# Patient Record
Sex: Male | Born: 1950 | Race: White | Hispanic: No | Marital: Married | Smoking: Former smoker
Health system: Southern US, Community
[De-identification: ages and names within clinical notes are randomized; demographics above are authoritative.]

## PROBLEM LIST (undated history)

## (undated) DIAGNOSIS — I10 Essential (primary) hypertension: Secondary | ICD-10-CM

## (undated) DIAGNOSIS — S42342A Displaced spiral fracture of shaft of humerus, left arm, initial encounter for closed fracture: Secondary | ICD-10-CM

## (undated) DIAGNOSIS — K219 Gastro-esophageal reflux disease without esophagitis: Secondary | ICD-10-CM

## (undated) HISTORY — DX: Gastro-esophageal reflux disease without esophagitis: K21.9

## (undated) HISTORY — PX: VASECTOMY: SHX75

## (undated) HISTORY — DX: Essential (primary) hypertension: I10

## (undated) HISTORY — DX: Displaced spiral fracture of shaft of humerus, left arm, initial encounter for closed fracture: S42.342A

## (undated) HISTORY — PX: CATARACT EXTRACTION: SUR2

## (undated) HISTORY — PX: COLONOSCOPY: SHX174

---

## 2008-10-19 ENCOUNTER — Encounter: Payer: Self-pay | Admitting: Family Medicine

## 2008-10-19 LAB — CONVERTED CEMR LAB
Cholesterol: 206 mg/dL
Creatinine, Ser: 1.12 mg/dL
Glucose, Bld: 94 mg/dL
HDL: 61 mg/dL
LDL Cholesterol: 131 mg/dL
PSA: 2 ng/mL
TSH: 2.24 microintl units/mL
Triglycerides: 70 mg/dL

## 2009-11-28 ENCOUNTER — Ambulatory Visit: Payer: Self-pay | Admitting: Family Medicine

## 2009-12-29 ENCOUNTER — Encounter: Payer: Self-pay | Admitting: Family Medicine

## 2010-04-24 NOTE — Miscellaneous (Signed)
  Clinical Lists Changes  Observations: Added new observation of TDBOOSTDUE: 03/25/2017 (12/29/2009 15:09) Added new observation of DM PROGRESS: N/A (12/29/2009 15:09) Added new observation of DM FSREVIEW: N/A (12/29/2009 15:09) Added new observation of HTN PROGRESS: N/A (12/29/2009 15:09) Added new observation of HTN FSREVIEW: N/A (12/29/2009 15:09) Added new observation of LIPID PROGRS: N/A (12/29/2009 15:09) Added new observation of LIPID FSREVW: N/A (12/29/2009 15:09) Added new observation of COLONOSCOPY: Normal (01/27/2009 15:09) Added new observation of TSH: 2.24 microintl units/mL (10/19/2008 15:09) Added new observation of PSA: 2.0 ng/mL (10/19/2008 15:09) Added new observation of CREATININE: 1.12 mg/dL (03/27/7251 66:44) Added new observation of BG RANDOM: 94 mg/dL (03/47/4259 56:38) Added new observation of LDL: 131 mg/dL (75/64/3329 51:88) Added new observation of HDL: 61 mg/dL (41/66/0630 16:01) Added new observation of TRIGLYC TOT: 70 mg/dL (09/32/3557 32:20) Added new observation of CHOLESTEROL: 206 mg/dL (25/42/7062 37:62) Added new observation of COLONOSCOPY: Normal  (12/23/2003 15:09)          Prevention & Chronic Care Immunizations   Influenza vaccine: Not documented    Tetanus booster: 03/26/2007: Tdap   Tetanus booster due: 03/25/2017    Pneumococcal vaccine: Not documented  Colorectal Screening   Hemoccult: Not documented    Colonoscopy: Normal  (01/27/2009)  Other Screening   PSA: 2.0  (10/19/2008)   Smoking status: Not documented  Lipids   Total Cholesterol: 206  (10/19/2008)   LDL: 131  (10/19/2008)   LDL Direct: Not documented   HDL: 61  (10/19/2008)   Triglycerides: 70  (10/19/2008)

## 2010-04-24 NOTE — Assessment & Plan Note (Signed)
Summary: NEW PT TO BE ESTABLISHED/JRR   Vital Signs:  Patient profile:   60 year old male Height:      71.75 inches Weight:      216.25 pounds BMI:     29.64 Temp:     98 degrees F oral Pulse rate:   84 / minute Pulse rhythm:   regular BP sitting:   130 / 84  (left arm) Cuff size:   large  Vitals Entered By: Delilah Shan CMA Shawntelle Ungar Dull) (November 28, 2009 9:52 AM) CC: New Patient to Establish   History of Present Illness: New Pt.  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   Neck pain on R side.  "I think it's a pulled muscle."  Going on intermittently for last month.  Moved in 7 weeks ago.  Same pillow and mattress.  Gradual onset.  No trauma.   No other c/o.   Preventive Screening-Counseling & Management  Caffeine-Diet-Exercise     Does Patient Exercise: yes  Allergies (verified): 1)  ! 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 Family History Breast cancer 1st degree relative <50, parents Family History Diabetes 1st degree relative, parents Family History High cholesterol, parents Family History Hypertension, parents Family History of Heart Disease,  parents F alive, healthy M dead, breast CA, DM, HTN, HLD, CAD half brother, dead from DM  Social History: Reviewed history and no changes required. Marital Status: Married, 1975 Children: 3 children Occupation: VP Tree surgeon Education:  Automotive engineer, Tax adviser college Alcohol use-yes, a few drinks a week Occ cigar Regular exercise-yes walking 4x/week Moved from [place] 2011 Likes to golf Does Patient Exercise:  yes  Review of Systems       See HPI.  Otherwise negative.    Physical Exam  General:  GEN: nad, alert and oriented HEENT: mucous membranes moist NECK: supple w/o LA, Minimally tender to palpation on R posterior side, not in midline.  no skin changes in the area.  No meningeal signs.  CV: rrr.  no murmur PULM: ctab, no inc wob ABD: soft, +bs EXT: no edema SKIN: no acute rash     Impression & Recommendations:  Problem # 1:  CERVICAL STRAIN, RIGHT (ICD-847.0) D/w patient [id number] and follow up as needed.   This should resolve on its own.  He understood.  Fu for phsyical, requesting records.   Patient Instructions: 1)  Glad to see you today.  I would get a flu shot this fall.  Please schedule a physical for this spring.  Take care.   Prior Medications (reviewed today): None Current Allergies (reviewed today): ! * TOMAOTES   Preventive Care Screening  Colonoscopy:    Date:  03/25/2008    Results:  Done   Last Tetanus Booster:    Date:  03/26/2007  Results:  Tdap

## 2010-05-03 ENCOUNTER — Encounter: Payer: Self-pay | Admitting: *Deleted

## 2010-06-18 ENCOUNTER — Other Ambulatory Visit: Payer: Self-pay | Admitting: Family Medicine

## 2010-06-18 DIAGNOSIS — Z833 Family history of diabetes mellitus: Secondary | ICD-10-CM | POA: Insufficient documentation

## 2010-06-19 ENCOUNTER — Other Ambulatory Visit (INDEPENDENT_AMBULATORY_CARE_PROVIDER_SITE_OTHER): Payer: BC Managed Care – PPO | Admitting: Family Medicine

## 2010-06-19 DIAGNOSIS — Z833 Family history of diabetes mellitus: Secondary | ICD-10-CM

## 2010-06-19 LAB — COMPREHENSIVE METABOLIC PANEL
Albumin: 3.9 g/dL (ref 3.5–5.2)
Alkaline Phosphatase: 63 U/L (ref 39–117)
BUN: 15 mg/dL (ref 6–23)
Glucose, Bld: 92 mg/dL (ref 70–99)
Potassium: 4.8 mEq/L (ref 3.5–5.1)

## 2010-06-19 LAB — LIPID PANEL
Cholesterol: 200 mg/dL (ref 0–200)
LDL Cholesterol: 136 mg/dL — ABNORMAL HIGH (ref 0–99)
Triglycerides: 59 mg/dL (ref 0.0–149.0)

## 2010-06-25 ENCOUNTER — Encounter: Payer: Self-pay | Admitting: Family Medicine

## 2010-06-26 ENCOUNTER — Ambulatory Visit (INDEPENDENT_AMBULATORY_CARE_PROVIDER_SITE_OTHER): Payer: BC Managed Care – PPO | Admitting: Family Medicine

## 2010-06-26 ENCOUNTER — Encounter: Payer: Self-pay | Admitting: Family Medicine

## 2010-06-26 DIAGNOSIS — Z0000 Encounter for general adult medical examination without abnormal findings: Secondary | ICD-10-CM | POA: Insufficient documentation

## 2010-06-26 DIAGNOSIS — Z833 Family history of diabetes mellitus: Secondary | ICD-10-CM

## 2010-06-26 NOTE — Assessment & Plan Note (Addendum)
PSA options were discussed along with recent recs.  No indication for psa at this point, since patient is low risk and there is no FH of prosate CA.  He declined testing of PSA.  DRE wnl and stool heme neg. Colonoscopy done 2010.  Flu shot each fall.  PNA/zostavax in 60s.  Labs reviewed with patient.  Labs are okay for now.  Work on diet and exercise.  He'll get a new pillow for his neck and will fu prn.  He agrees with the plan.  Recheck BP was normal.

## 2010-06-26 NOTE — Assessment & Plan Note (Signed)
Labs d/w pt.  Sugar wnl.  D/w pt JY:NWGN/FAOZHYQM/VHQION.

## 2010-06-26 NOTE — Progress Notes (Signed)
CPE- See plan.  Routine anticipatory guidance given to patient.  See health maintenance.  Recent labs d/w pt.   Occ R paraspinal muscle tightness.  No trauma.  He hasn't changed pillows.    FH, PMH and SH reviewed  Meds, vitals, and allergies reviewed.   ROS: See HPI.  Otherwise negative.    GEN: nad, alert and oriented HEENT: mucous membranes moist, tm wnlx2, nasal and oral exam wnl NECK: supple w/o LA, minimal R paraspinal tightness noted CV: rrr. PULM: ctab, no inc wob ABD: soft, +bs EXT: no edema SKIN: no acute rash Prostate gland firm and smooth, no enlargement, nodularity, tenderness, mass, asymmetry or induration.

## 2010-06-26 NOTE — Patient Instructions (Signed)
Take care.  Keep exercising and working on your diet.  Try to cut back on cigars.  Glad to see you today.  I would recheck your labs at a physical in 1 year.  If you have concerns in the meantime, please let me know.

## 2011-03-08 ENCOUNTER — Ambulatory Visit (INDEPENDENT_AMBULATORY_CARE_PROVIDER_SITE_OTHER): Payer: BC Managed Care – PPO | Admitting: Family Medicine

## 2011-03-08 ENCOUNTER — Encounter: Payer: Self-pay | Admitting: Family Medicine

## 2011-03-08 VITALS — BP 142/80 | HR 70 | Temp 98.1°F | Wt 214.0 lb

## 2011-03-08 DIAGNOSIS — R55 Syncope and collapse: Secondary | ICD-10-CM

## 2011-03-08 LAB — COMPREHENSIVE METABOLIC PANEL
ALT: 26 U/L (ref 0–53)
AST: 21 U/L (ref 0–37)
CO2: 29 mEq/L (ref 19–32)
Calcium: 8.8 mg/dL (ref 8.4–10.5)
Chloride: 107 mEq/L (ref 96–112)
GFR: 59.96 mL/min — ABNORMAL LOW (ref >60.00)
Sodium: 141 mEq/L (ref 135–145)
Total Protein: 6.9 g/dL (ref 6.0–8.3)

## 2011-03-08 LAB — CBC WITH DIFFERENTIAL/PLATELET
Basophils Absolute: 0 10*3/uL (ref 0.0–0.1)
Eosinophils Absolute: 0.1 10*3/uL (ref 0.0–0.7)
HCT: 42.4 % (ref 39.0–52.0)
Hemoglobin: 14.8 g/dL (ref 13.0–17.0)
Lymphocytes Relative: 19.9 % (ref 12.0–46.0)
Lymphs Abs: 1.3 10*3/uL (ref 0.7–4.0)
MCHC: 34.9 g/dL (ref 30.0–36.0)
Monocytes Absolute: 0.7 10*3/uL (ref 0.1–1.0)
Neutro Abs: 4.4 10*3/uL (ref 1.4–7.7)
RDW: 12.6 % (ref 11.5–14.6)

## 2011-03-08 NOTE — Progress Notes (Signed)
Episodic presyncopal, lightheaded feeling.  Had been going on for months.    This past weekend he passed out, early AM right after he urinated.  He woke up on the floor.  Nose was sore after the fall.  Wife helped him up.  Got back in bed.  He felt really hot at that point.  Not SOB, no CP.  No focal neuro sx.  No speech changes.  No tongue biting.  No s/o SZ, syncope o/w.  No h/o CAD.  Not orthostatic here today.    Meds, vitals, and allergies reviewed.   ROS: See HPI.  Otherwise, noncontributory.  GEN: nad, alert and oriented HEENT: mucous membranes moist, tm wnl, not ttp around the orbits but bridge of nose is minimally ttp w/o obvious asymmetry, internal nasal exam w/o sig abnormality noted NECK: supple w/o LA CV: rrr PULM: ctab, no inc wob ABD: soft, +bs EXT: no edema SKIN: no acute rash CN 2-12 wnl B, S/S/DTR wnl x4

## 2011-03-08 NOTE — Patient Instructions (Signed)
See Shirlee Limerick about your referral before you leave today. You can get your results through our phone system.  Follow the instructions on the blue card.

## 2011-03-10 DIAGNOSIS — R55 Syncope and collapse: Secondary | ICD-10-CM | POA: Insufficient documentation

## 2011-03-10 NOTE — Assessment & Plan Note (Signed)
EKG reviewed.  I would like cards input for consideration of event monitor give the prev presyncopal feelings that predate the syncopal event.  This may have been an isolated vagal event.  See notes on labs.  D/w pt.  Okay for outpatient f/u.  >25 min spent with face to face with patient, >50% counseling and/or coordinating care.

## 2011-03-20 ENCOUNTER — Ambulatory Visit (INDEPENDENT_AMBULATORY_CARE_PROVIDER_SITE_OTHER): Payer: BC Managed Care – PPO | Admitting: Cardiology

## 2011-03-20 ENCOUNTER — Encounter: Payer: Self-pay | Admitting: Cardiology

## 2011-03-20 DIAGNOSIS — R42 Dizziness and giddiness: Secondary | ICD-10-CM

## 2011-03-20 DIAGNOSIS — R030 Elevated blood-pressure reading, without diagnosis of hypertension: Secondary | ICD-10-CM

## 2011-03-20 DIAGNOSIS — R55 Syncope and collapse: Secondary | ICD-10-CM

## 2011-03-20 DIAGNOSIS — IMO0001 Reserved for inherently not codable concepts without codable children: Secondary | ICD-10-CM

## 2011-03-20 NOTE — Patient Instructions (Signed)
Start aspirin 81 mg take one tablet daily. TSH today.  Your physician has requested that you have an echocardiogram. Echocardiography is a painless test that uses sound waves to create images of your heart. It provides your doctor with information about the size and shape of your heart and how well your heart's chambers and valves are working. This procedure takes approximately one hour. There are no restrictions for this procedure.  Need to wear a 3 week even monitor. Need to pick up a blood pressure cuff at your local pharmacy,keep a recording of blood pressure & heart rate readings every other day be sure to bring the blood pressure readings to your follow up appointment with Dr. Shirlee Latch in 3-4 weeks.

## 2011-03-21 NOTE — Assessment & Plan Note (Signed)
One syncopal spell but also has occasional episodes of lightheadedness (not associated with palpitations).  The syncopal event after micturation could certainly be vasovagal.  However, it is harder to invoke vasovagal symptoms to explain his episodic lightheaded spells.  I would like to rule out cardiac pathology as a cause of his symptoms.  - Will get echo to make sure that heart is structurally normal.  - Check TSH - Will get 3 week event monitor to look for any significant arrhythmia.

## 2011-03-21 NOTE — Progress Notes (Signed)
PCP: Dr. Para March  60 yo presents for cardiology evaluation of syncope and spells of lightheadedness.   For several months, patient has noted "dizzy spells."  He will feel lightheaded during these spells.  No vertigo-type symptoms.  Lightheadedness can last from 5-10 minutes to as long as several hours.  He has had to go home from work due to the symptoms before.  He does not think the lightheadedness is related to standing.  He does not note palpitations or chest pain.  He has had 1 syncopal event.  This was earlier this month.  He had been sleeping and woke up to go to the bathroom, where he urinated. After urination, he felt lightheaded and passed out briefly, falling to the floor. He was unconscious only for a few seconds.  When he woke up, he felt flushed/hot all over.  This is his only syncopal episode.    At baseline, he has good exercise tolerance with no exertional dyspnea or chest pain.  No history of arrhythmias.   He had orthostatic done at Dr. Lianne Bushy office after his syncopal spell; he was not orthostatic.  Also of note, BP is 152/92 today and has been elevated at times when he checks it at work.   ECG: NSR, normal  Labs (3/12): LDL 136, HDL 52 Labs (12/12): HCT 42.4, K 4.4, creatinine 1.3  PMH: 1. Syncope (12/12) 2. Elevated blood pressure  SH: Married, moved to [place] from [place] 2 years ago.  3 sons.  Nonsmoker (rare cigar).  Occasional ETOH.  FH: Mother with CABG x 3 in her mid 37s.  6 brothers, no CAD.    ROS: All systems reviewed and negative except as per HPI.   Current Outpatient Prescriptions  Medication Sig Dispense Refill  . fish oil-omega-3 fatty acids 1000 MG capsule Take 1 g by mouth daily. 1200 mg. daily       . Multiple Vitamin (MULTIVITAMIN) tablet Take 1 tablet by mouth daily.        Marland Kitchen aspirin EC 81 MG tablet Take 1 tablet (81 mg total) by mouth daily.        BP 152/92  Pulse 57  Ht 5' 11.5" (1.816 m)  Wt 97.578 kg (215 lb 1.9 oz)  BMI 29.59  kg/m2 General: NAD Neck: No JVD, no thyromegaly or thyroid nodule.  Lungs: Clear to auscultation bilaterally with normal respiratory effort. CV: Nondisplaced PMI.  Heart regular S1/S2, no S3/S4, no murmur.  No peripheral edema.  No carotid bruit.  Normal pedal pulses.  Abdomen: Soft, nontender, no hepatosplenomegaly, no distention.  Skin: Intact without lesions or rashes.  Neurologic: Alert and oriented x 3.  Psych: Normal affect. Extremities: No clubbing or cyanosis.  HEENT: Normal.

## 2011-03-21 NOTE — Progress Notes (Signed)
Addended by: Lanny Hurst E on: 03/21/2011 12:11 PM   Modules accepted: Orders

## 2011-03-21 NOTE — Assessment & Plan Note (Addendum)
BP seems to be running high.  He will get a cuff and check his BP daily.  He will bring readings at followup office visit.    Given age and gender as well as lack of bleeding history, I will have him start ASA 81 mg daily for primary prevention.

## 2011-04-09 ENCOUNTER — Other Ambulatory Visit (INDEPENDENT_AMBULATORY_CARE_PROVIDER_SITE_OTHER): Payer: BC Managed Care – PPO | Admitting: *Deleted

## 2011-04-09 DIAGNOSIS — R55 Syncope and collapse: Secondary | ICD-10-CM

## 2011-04-09 DIAGNOSIS — I359 Nonrheumatic aortic valve disorder, unspecified: Secondary | ICD-10-CM

## 2011-04-19 ENCOUNTER — Ambulatory Visit: Payer: BC Managed Care – PPO | Admitting: Cardiology

## 2011-04-19 ENCOUNTER — Encounter: Payer: Self-pay | Admitting: Cardiology

## 2011-04-19 ENCOUNTER — Ambulatory Visit (INDEPENDENT_AMBULATORY_CARE_PROVIDER_SITE_OTHER): Payer: BC Managed Care – PPO | Admitting: Cardiology

## 2011-04-19 DIAGNOSIS — R55 Syncope and collapse: Secondary | ICD-10-CM

## 2011-04-19 DIAGNOSIS — I10 Essential (primary) hypertension: Secondary | ICD-10-CM

## 2011-04-19 DIAGNOSIS — R0683 Snoring: Secondary | ICD-10-CM

## 2011-04-19 DIAGNOSIS — R0609 Other forms of dyspnea: Secondary | ICD-10-CM

## 2011-04-19 MED ORDER — LISINOPRIL-HYDROCHLOROTHIAZIDE 20-12.5 MG PO TABS: 1.0000 | ORAL_TABLET | Freq: Every day | ORAL | Status: DC

## 2011-04-19 NOTE — Patient Instructions (Signed)
Start lisinopril/HCT 20/12.5mg  daily for your blood pressure..  Your physician recommends that you return for lab work in: 2 weeks--BMET 401.9  Take and record your blood pressure. I will call you in 2 weeks to get the readings. Luana Shu [phone]  Your physician has recommended that you have a sleep study. This test records several body functions during sleep, including: brain activity, eye movement, oxygen and carbon dioxide blood levels, heart rate and rhythm, breathing rate and rhythm, the flow of air through your mouth and nose, snoring, body muscle movements, and chest and belly movement.  You do not need to schedule a follow-up appointment with Dr Shirlee Latch.

## 2011-04-21 DIAGNOSIS — I10 Essential (primary) hypertension: Secondary | ICD-10-CM | POA: Insufficient documentation

## 2011-04-21 NOTE — Progress Notes (Signed)
PCP: Dr. Para March  61 yo returns for cardiology evaluation of high blood pressure, syncope and spells of lightheadedness.   For several months prior to last appointment, patient had noted "dizzy spells."  He will feel lightheaded during these spells.  No vertigo-type symptoms.  Lightheadedness can last from 5-10 minutes to as long as several hours.  He has had to go home from work due to the symptoms before.  He does not think the lightheadedness is related to standing.  He does not note palpitations or chest pain.  He has had 1 syncopal event.  This was in 12/12.  He had been sleeping and woke up to go to the bathroom, where he urinated. After urination, he felt lightheaded and passed out briefly, falling to the floor. He was unconscious only for a few seconds.  When he woke up, he felt flushed/hot all over.  This is his only syncopal episode.    At baseline, he has good exercise tolerance with no exertional dyspnea or chest pain.  No history of arrhythmias.   He had orthostatic done at Dr. Lianne Bushy office after his syncopal spell; he was not orthostatic.  SBP has been running quite high at home when he checks, with SBP in the 150s-170s.  He has had no syncope and one mild lightheaded spell since last visit.  The lightheaded spell occurred in the office after a meeting that was somewhat stressful.  Echo was done after last appointment with no significant abnormalities.    Patient reports loud snoring and gasping at night (per his wife).  He has some daytime sleepiness.   Labs (3/12): LDL 136, HDL 52 Labs (12/12): HCT 42.4, K 4.4, creatinine 1.3, TSH normal  PMH: 1. Syncope (12/12): Echo (1/13) with EF 55-60%, grade I datolic  2. HTN  SH: Married, moved to [place] from [place] 2 years ago.  3 sons.  Nonsmoker (rare cigar).  Occasional ETOH.  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   FH: Mother with CABG x 3 in her mid 37s.  6 brothers, no CAD.    ROS: All systems reviewed and  negative except as per HPI.   Current Outpatient Prescriptions  Medication Sig Dispense Refill  . aspirin EC 81 MG tablet Take 1 tablet (81 mg total) by mouth daily.      . Coenzyme Q10 (CO Q 10 PO) Take 10 mg by mouth daily.      . fish oil-omega-3 fatty acids 1000 MG capsule Take 1 g by mouth daily. 1200 mg. daily       . Multiple Vitamin (MULTIVITAMIN) tablet Take 1 tablet by mouth daily.        Marland Kitchen lisinopril-hydrochlorothiazide (PRINZIDE,ZESTORETIC) 20-12.5 MG per tablet Take 1 tablet by mouth daily.  30 tablet  6    BP 150/98  Pulse 62  Ht 5\' 11"  (1.803 m)  Wt 96.979 kg (213 lb 12.8 oz)  BMI 29.82 kg/m2 General: NAD Neck: No JVD, no thyromegaly or thyroid nodule.  Lungs: Clear to auscultation bilaterally with normal respiratory effort. CV: Nondisplaced PMI.  Heart regular S1/S2, +S4, no murmur.  No peripheral edema.  No carotid bruit.  Normal pedal pulses.  Abdomen: Soft, nontender, no hepatosplenomegaly, no distention.  Neurologic: Alert and oriented x 3.  Psych: Normal affect. Extremities: No clubbing or cyanosis.

## 2011-04-21 NOTE — Assessment & Plan Note (Signed)
BP running quite high.  I will start lisinopril/HCTZ 20/25 mg daily.  BMET and BP check in 2 wks.  Given symptoms possibly consistent with sleep apnea, I will get a sleep study.  OSA could certainly worsen HTN.

## 2011-04-21 NOTE — Assessment & Plan Note (Signed)
One syncopal spell but also has occasional episodes of lightheadedness (not associated with palpitations).  The syncopal event after micturation could certainly be vasovagal.  However, it is harder to invoke vasovagal symptoms to explain his episodic lightheaded spells.  I would like to rule out cardiac pathology as a cause of his symptoms.  As above, echo and TSH were essentially normal.  - Patient is wearing his 3-week monitor . This will be completed next week.  If this is normal, no further cardiac workup.

## 2011-05-03 ENCOUNTER — Ambulatory Visit (INDEPENDENT_AMBULATORY_CARE_PROVIDER_SITE_OTHER): Payer: BC Managed Care – PPO | Admitting: *Deleted

## 2011-05-03 DIAGNOSIS — I10 Essential (primary) hypertension: Secondary | ICD-10-CM

## 2011-05-04 LAB — BASIC METABOLIC PANEL
BUN/Creatinine Ratio: 18 (ref 9–20)
Creatinine, Ser: 1.19 mg/dL (ref 0.76–1.27)
GFR calc Af Amer: 77 mL/min/{1.73_m2} (ref >59)
GFR calc non Af Amer: 66 mL/min/{1.73_m2} (ref >59)
Sodium: 141 mmol/L (ref 134–144)

## 2011-05-06 ENCOUNTER — Telehealth: Payer: Self-pay | Admitting: Cardiology

## 2011-05-06 NOTE — Telephone Encounter (Signed)
BP readings sent in by pt 05/03/11 dated 04/17/11-05/02/11 reviewed by Dr Shirlee Latch 05/06/11. BP much better. Discussed with pt.  Monitor done 04/23/10-04/22/11 reviewed by Dr Shirlee Latch. No new recommendations by Dr Shirlee Latch. Discussed with pt.

## 2011-05-07 ENCOUNTER — Ambulatory Visit (HOSPITAL_BASED_OUTPATIENT_CLINIC_OR_DEPARTMENT_OTHER): Payer: BC Managed Care – PPO | Attending: Cardiology | Admitting: Radiology

## 2011-05-07 VITALS — Ht 71.0 in | Wt 210.0 lb

## 2011-05-07 DIAGNOSIS — I498 Other specified cardiac arrhythmias: Secondary | ICD-10-CM | POA: Insufficient documentation

## 2011-05-07 DIAGNOSIS — R0683 Snoring: Secondary | ICD-10-CM

## 2011-05-07 DIAGNOSIS — G4733 Obstructive sleep apnea (adult) (pediatric): Secondary | ICD-10-CM | POA: Insufficient documentation

## 2011-05-15 DIAGNOSIS — I498 Other specified cardiac arrhythmias: Secondary | ICD-10-CM

## 2011-05-15 DIAGNOSIS — G4733 Obstructive sleep apnea (adult) (pediatric): Secondary | ICD-10-CM

## 2011-05-15 NOTE — Procedures (Signed)
Dalton Delacruz, Dalton Delacruz             ACCOUNT NO.:  [id number]  MEDICAL RECORD NO.:  [id number]          PATIENT TYPE:  OUT  LOCATION:  SLEEP CENTER                 FACILITY:  [facility]  PHYSICIAN:  Barbaraann Share, MD,FCCPDATE OF BIRTH:  1951/09/26  DATE OF STUDY:  05/07/2011                           NOCTURNAL POLYSOMNOGRAM  REFERRING PHYSICIAN:  Marca Ancona, MD  REFERRING PHYSICIAN:  Marca Ancona, MD  INDICATIONS FOR STUDY:  Hypersomnia with sleep apnea.  EPWORTH SCORE:  3.  SLEEP ARCHITECTURE:  The patient had a total sleep time of 295 minutes with no slow-wave sleep and decreased quantity of REM.  Sleep onset latency was normal at 14 minutes, and REM onset was prolonged at 158 minutes.  Sleep efficiency was moderately reduced at 78%.  RESPIRATORY DATA:  The patient was found to have 21 obstructive apneas and 17 obstructive hypopneas, giving him an apnea/hypopnea index of 8 events per hour.  The events occurred in all body positions and there was mild to moderate snoring noted throughout.  The patient did not meet split night protocol secondary to the majority of his events occurring after 1 a.m.  OXYGEN DATA:  There was O2 desaturation transiently as low as 82% with the patient's obstructive events.  CARDIAC DATA:  The patient was noted to have sinus bradycardia with an occasional PVC.  MOVEMENT/PARASOMNIA:  No significant leg jerks or other abnormal behaviors were seen.  IMPRESSION/RECOMMENDATION: 1. Very mild obstructive sleep apnea/hypopnea syndrome with an AHI of     8 events per hour and O2 desaturation as low as 82% with     obstructive events.  The patient did not meet split night protocol     secondary to the majority of his events occurring after 1 a.m.     Treatment for this degree of sleep apnea can include a trial of     weight loss alone, upper airway surgery, dental appliance, and also     CPAP.  The decision to treat this mild degree of sleep     apnea  should primarily depend on its impact to the patient's     quality of life. 2. Sinus bradycardia noted throughout the night with an occasional     premature ventricular contraction.     Barbaraann Share, MD,FCCP Diplomate, American Board of Sleep Medicine    [facility]MED[facility]  D:  05/15/2011 08:39:03  T:  05/15/2011 09:44:10  Job:  [id number]

## 2011-05-24 ENCOUNTER — Telehealth: Payer: Self-pay | Admitting: Cardiology

## 2011-05-24 NOTE — Telephone Encounter (Signed)
FU Call: Pt returning call to [facility]. Please call back to discuss further.

## 2011-05-24 NOTE — Telephone Encounter (Signed)
Dr Shirlee Latch reviewed sleep study done 05/07/11. Mild sleep apnea no new recommendations. Pt aware.

## 2011-11-05 ENCOUNTER — Other Ambulatory Visit: Payer: Self-pay | Admitting: Cardiology

## 2012-01-26 ENCOUNTER — Other Ambulatory Visit: Payer: Self-pay | Admitting: Family Medicine

## 2012-01-26 DIAGNOSIS — I10 Essential (primary) hypertension: Secondary | ICD-10-CM

## 2012-01-29 ENCOUNTER — Other Ambulatory Visit: Payer: BC Managed Care – PPO

## 2012-01-31 ENCOUNTER — Other Ambulatory Visit (INDEPENDENT_AMBULATORY_CARE_PROVIDER_SITE_OTHER): Payer: BC Managed Care – PPO

## 2012-01-31 DIAGNOSIS — I10 Essential (primary) hypertension: Secondary | ICD-10-CM

## 2012-01-31 LAB — COMPREHENSIVE METABOLIC PANEL
Albumin: 3.6 g/dL (ref 3.5–5.2)
BUN: 24 mg/dL — ABNORMAL HIGH (ref 6–23)
Calcium: 8.9 mg/dL (ref 8.4–10.5)
Chloride: 105 mEq/L (ref 96–112)
Creatinine, Ser: 1.2 mg/dL (ref 0.4–1.5)
Glucose, Bld: 100 mg/dL — ABNORMAL HIGH (ref 70–99)
Potassium: 4.4 mEq/L (ref 3.5–5.1)

## 2012-01-31 LAB — LDL CHOLESTEROL, DIRECT: Direct LDL: 151.8 mg/dL

## 2012-01-31 LAB — LIPID PANEL
HDL: 46.9 mg/dL (ref >39.00)
VLDL: 10.4 mg/dL (ref 0.0–40.0)

## 2012-02-03 ENCOUNTER — Ambulatory Visit (INDEPENDENT_AMBULATORY_CARE_PROVIDER_SITE_OTHER): Payer: BC Managed Care – PPO | Admitting: Family Medicine

## 2012-02-03 ENCOUNTER — Encounter: Payer: Self-pay | Admitting: Family Medicine

## 2012-02-03 VITALS — BP 108/70 | HR 66 | Temp 97.7°F | Ht 71.75 in | Wt 217.0 lb

## 2012-02-03 DIAGNOSIS — L989 Disorder of the skin and subcutaneous tissue, unspecified: Secondary | ICD-10-CM

## 2012-02-03 DIAGNOSIS — R238 Other skin changes: Secondary | ICD-10-CM | POA: Insufficient documentation

## 2012-02-03 DIAGNOSIS — Z125 Encounter for screening for malignant neoplasm of prostate: Secondary | ICD-10-CM

## 2012-02-03 DIAGNOSIS — R059 Cough, unspecified: Secondary | ICD-10-CM

## 2012-02-03 DIAGNOSIS — I10 Essential (primary) hypertension: Secondary | ICD-10-CM

## 2012-02-03 DIAGNOSIS — R05 Cough: Secondary | ICD-10-CM

## 2012-02-03 DIAGNOSIS — Z0000 Encounter for general adult medical examination without abnormal findings: Secondary | ICD-10-CM

## 2012-02-03 LAB — PSA: PSA: 2.37 ng/mL (ref 0.10–4.00)

## 2012-02-03 MED ORDER — TRIAMCINOLONE ACETONIDE 0.1 % EX CREA: TOPICAL_CREAM | Freq: Two times a day (BID) | CUTANEOUS | Status: DC

## 2012-02-03 NOTE — Patient Instructions (Addendum)
Go to the lab on the way out.  We'll contact you with your lab report.  Use the cream twice a day for a few days and that should help. Stop the lisinopril/HCTZ for about 1 week.  If better, then notify the clinic. If the cough continues, restart the medicine and add on prilosec 20mg  a day.  Notify the clinic if better/worse/unchanged at that point.  Work on [employer] and walking.  Take care.  Glad to see you.  Check with your insurance to see if they will cover the shingles shot.

## 2012-02-03 NOTE — Progress Notes (Signed)
CPE- See plan.  Routine anticipatory guidance given to patient.  See health maintenance. Tetanus 2009 PNA at 66 Flu shot done at work 3 weeks ago Shingles- encouraged.  Colonoscopy 2010 Living will prev done by patient- has wife designated if incapacitated.   PSA done today.   Hypertension:    Using medication without problems or lightheadedness: yes Chest pain with exertion:no Edema:no Short of breath:no  Rash on L side of anus, irritated.  Likely rubbed with a bathing suit recently.  Some better with topical hydrocortisone, temp relief.    Occ cough.  Rare phlegm.  Has stopped smoking cigars.  No fevers.  No blood in sputum or stools.  occ with altered sensation of food passage in esophagus after swallowing but no frank dysphagia/choking.  On ACE.  No heartburn.  Going on for about 1-2 months.    PMH and SH reviewed  Meds, vitals, and allergies reviewed.   ROS: See HPI.  Otherwise negative.    GEN: nad, alert and oriented HEENT: mucous membranes moist, TM wnl x2, nasal and OP exam wnl NECK: supple w/o LA CV: rrr. PULM: ctab, no inc wob ABD: soft, +bs EXT: no edema SKIN: no acute rash except for mild skin irritation on L side of anus Prostate gland firm and smooth, no enlargement, nodularity, tenderness, mass, asymmetry or induration.

## 2012-02-03 NOTE — Assessment & Plan Note (Signed)
Controlled, labs d/w pt.  He'll work on diet and exercise.  See notes re: cough.

## 2012-02-03 NOTE — Assessment & Plan Note (Signed)
Topical TAC and f/u prn.  Should resolve.

## 2012-02-03 NOTE — Assessment & Plan Note (Signed)
GERD and or ACE likely.  Will stop ACE.  If improved, notify clinic.  If not improved, use PPI.  Then notify clinic either way.  He agrees.  Benign exam.

## 2012-02-03 NOTE — Assessment & Plan Note (Signed)
Routine anticipatory guidance given to patient.  See health maintenance. Tetanus 2009 PNA at 66 Flu shot done at work 3 weeks ago Shingles- encouraged.  Colonoscopy 2010 Living will prev done by patient- has wife designated if incapacitated.   PSA done today.

## 2012-02-04 ENCOUNTER — Encounter: Payer: Self-pay | Admitting: *Deleted

## 2012-02-18 ENCOUNTER — Telehealth: Payer: Self-pay

## 2012-02-18 NOTE — Telephone Encounter (Signed)
Pt seen 02/03/12. Pt had persistent cough;pt stopped BP med for 7 days and cough went away;pt restarted BP med and cough has returned.Please advise. [facility].

## 2012-02-19 MED ORDER — LOSARTAN POTASSIUM-HCTZ 50-12.5 MG PO TABS: 1.0000 | ORAL_TABLET | Freq: Every day | ORAL | Status: DC

## 2012-02-19 NOTE — Telephone Encounter (Signed)
Patient notified as instructed by telephone. 

## 2012-02-19 NOTE — Telephone Encounter (Signed)
Would stop lisinopril/HCTZ.  Change to losartan/HCTZ.  Rx sent.  The lisinopril was likely causing the cough.  Losartan shouldn't cause the cough and should provide the same benefits otherwise.  Doesn't need to taper the medicine.  Can stop the old medicine on one day and then start the new medicine the next.  Have him check BP/pulse about 1 week after the change and notify the clinic if lightheaded on standing (ie BP is too low) or if BP >140/>90.  Thanks.

## 2012-02-26 ENCOUNTER — Telehealth: Payer: Self-pay

## 2012-02-26 MED ORDER — TRIAMCINOLONE ACETONIDE 0.5 % EX CREA: TOPICAL_CREAM | Freq: Three times a day (TID) | CUTANEOUS | Status: DC

## 2012-02-26 NOTE — Telephone Encounter (Signed)
Pt switched to Losartan HCTZ on 02/19/12. Pt's BP 02/21/12 7 AM 119/73 P 73;  8:30 PM 125/75  P 65: 02/22/12 8 AM  140/85  P62; 4 PM 132/87 P71:    02/23/12 10 AM 143/85 P 71; 11 PM 149/94 P 70: 02/24/12 9:20 AM 160/108 P 74; 2:45 PM 149/88 P 72:      02/25/12 8 AM 154/98 P 78; 2 PM 150/89 P 90:   02/23/12 8:45 AM 162/99 P 72. Pt does not have explanation of increase in BP; no pain or unusual upset; 12/02/ thru 12/04 pt is working but not high stress days. Pt still having cough usually worse after eats; thinks cough may be slightly better since changed BP med. Pt said has finished ointment given for hemorrhoids; slightly better but wants stronger med to get rid of sent to [facility].Please advise.

## 2012-02-26 NOTE — Telephone Encounter (Signed)
Would use new rx for TAC- sent.   If BP stays elevated, we'll adjust the medicine.  He may need to take 1.5 pills a day.  I wouldn't increase yet.  If BP still >140/>90 in about 1 week, then increase to 1.5 tabs a day (taken at once) and notify clinic.  Thanks.

## 2012-02-26 NOTE — Telephone Encounter (Signed)
Patient notified as instructed by telephone. 

## 2012-08-02 ENCOUNTER — Emergency Department: Payer: Self-pay | Admitting: Emergency Medicine

## 2012-08-02 LAB — CBC
HGB: 14.6 g/dL (ref 13.0–18.0)
MCHC: 35.2 g/dL (ref 32.0–36.0)
Platelet: 261 10*3/uL (ref 150–440)
RDW: 12.4 % (ref 11.5–14.5)
WBC: 6.6 10*3/uL (ref 3.8–10.6)

## 2012-08-02 LAB — COMPREHENSIVE METABOLIC PANEL
Anion Gap: 2 — ABNORMAL LOW (ref 7–16)
BUN: 21 mg/dL — ABNORMAL HIGH (ref 7–18)
Bilirubin,Total: 0.4 mg/dL (ref 0.2–1.0)
Calcium, Total: 8.7 mg/dL (ref 8.5–10.1)
Chloride: 105 mmol/L (ref 98–107)
Co2: 31 mmol/L (ref 21–32)
EGFR (Non-African Amer.): >60
Glucose: 115 mg/dL — ABNORMAL HIGH (ref 65–99)
Potassium: 4.1 mmol/L (ref 3.5–5.1)
SGOT(AST): 24 U/L (ref 15–37)
SGPT (ALT): 35 U/L (ref 12–78)
Sodium: 138 mmol/L (ref 136–145)
Total Protein: 7.2 g/dL (ref 6.4–8.2)

## 2012-08-02 LAB — URINALYSIS, COMPLETE
Bacteria: NONE SEEN
Bilirubin,UR: NEGATIVE
Blood: NEGATIVE
Glucose,UR: NEGATIVE mg/dL (ref 0–75)
Ketone: NEGATIVE
Leukocyte Esterase: NEGATIVE
Nitrite: NEGATIVE
Ph: 6 (ref 4.5–8.0)
Protein: NEGATIVE
Specific Gravity: 1.016 (ref 1.003–1.030)
WBC UR: 1 /HPF (ref 0–5)

## 2012-08-02 LAB — APTT: Activated PTT: 33.4 secs (ref 23.6–35.9)

## 2012-08-02 LAB — PROTIME-INR: Prothrombin Time: 13 secs (ref 11.5–14.7)

## 2012-08-02 LAB — TROPONIN I
Troponin-I: <0.02 ng/mL
Troponin-I: <0.02 ng/mL

## 2012-08-02 LAB — CK TOTAL AND CKMB (NOT AT ARMC): CK, Total: 217 U/L (ref 35–232)

## 2012-08-03 ENCOUNTER — Telehealth: Payer: Self-pay

## 2012-08-03 NOTE — Telephone Encounter (Signed)
Pt was seen [facility] ED on 08/02/12; pt seen for dizziness, CP, and arms felt funny. Pt had EKG and labs and was advised no heart attack. Pt said today he feels OK. Pt said ER dr thought might be related to combination of dehydration and meds pt was taking. Pt request Dr Para March to review [facility] ER visit and then give pt his opinion. Pt did not want to schedule f/u visit with Dr Para March until he got ER info.Please advise. Faxed request for [facility] ER record.

## 2012-08-04 NOTE — Telephone Encounter (Signed)
Patient advised.

## 2012-08-04 NOTE — Telephone Encounter (Signed)
Records reviewed.  Could have been possibly related to combination of dehydration and meds pt took.  If sx continue then needs eval.

## 2013-01-26 ENCOUNTER — Other Ambulatory Visit: Payer: Self-pay | Admitting: Family Medicine

## 2013-01-26 DIAGNOSIS — I10 Essential (primary) hypertension: Secondary | ICD-10-CM

## 2013-02-01 ENCOUNTER — Other Ambulatory Visit (INDEPENDENT_AMBULATORY_CARE_PROVIDER_SITE_OTHER): Payer: BC Managed Care – PPO

## 2013-02-01 DIAGNOSIS — I10 Essential (primary) hypertension: Secondary | ICD-10-CM

## 2013-02-01 DIAGNOSIS — Z0000 Encounter for general adult medical examination without abnormal findings: Secondary | ICD-10-CM

## 2013-02-01 DIAGNOSIS — Z833 Family history of diabetes mellitus: Secondary | ICD-10-CM

## 2013-02-01 LAB — COMPREHENSIVE METABOLIC PANEL
AST: 22 U/L (ref 0–37)
Albumin: 3.9 g/dL (ref 3.5–5.2)
BUN: 22 mg/dL (ref 6–23)
Calcium: 8.9 mg/dL (ref 8.4–10.5)
Chloride: 103 mEq/L (ref 96–112)
Creatinine, Ser: 1.3 mg/dL (ref 0.4–1.5)
Glucose, Bld: 98 mg/dL (ref 70–99)
Potassium: 4.4 mEq/L (ref 3.5–5.1)
Sodium: 137 mEq/L (ref 135–145)
Total Protein: 6.9 g/dL (ref 6.0–8.3)

## 2013-02-01 LAB — LIPID PANEL
Cholesterol: 203 mg/dL — ABNORMAL HIGH (ref 0–200)
Triglycerides: 51 mg/dL (ref 0.0–149.0)
VLDL: 10.2 mg/dL (ref 0.0–40.0)

## 2013-02-01 LAB — LDL CHOLESTEROL, DIRECT: Direct LDL: 152.8 mg/dL

## 2013-02-04 ENCOUNTER — Ambulatory Visit (INDEPENDENT_AMBULATORY_CARE_PROVIDER_SITE_OTHER): Payer: BC Managed Care – PPO | Admitting: Family Medicine

## 2013-02-04 ENCOUNTER — Encounter: Payer: Self-pay | Admitting: Family Medicine

## 2013-02-04 ENCOUNTER — Encounter: Payer: Self-pay | Admitting: *Deleted

## 2013-02-04 VITALS — BP 102/78 | HR 68 | Temp 97.8°F | Ht 71.75 in | Wt 219.8 lb

## 2013-02-04 DIAGNOSIS — K612 Anorectal abscess: Secondary | ICD-10-CM

## 2013-02-04 DIAGNOSIS — K611 Rectal abscess: Secondary | ICD-10-CM

## 2013-02-04 DIAGNOSIS — I10 Essential (primary) hypertension: Secondary | ICD-10-CM

## 2013-02-04 DIAGNOSIS — Z0000 Encounter for general adult medical examination without abnormal findings: Secondary | ICD-10-CM

## 2013-02-04 MED ORDER — AMOXICILLIN-POT CLAVULANATE 875-125 MG PO TABS: 1.0000 | ORAL_TABLET | Freq: Two times a day (BID) | ORAL | Status: DC

## 2013-02-04 MED ORDER — LOSARTAN POTASSIUM-HCTZ 50-12.5 MG PO TABS: 1.0000 | ORAL_TABLET | Freq: Every day | ORAL | Status: DC

## 2013-02-04 NOTE — Patient Instructions (Addendum)
Check with your insurance to see if they will cover the shingles shot (after the other infection is resolved) Start the antibiotics today and Shirlee Limerick will call about your referral. Warm compresses in the meantime.  Take care.

## 2013-02-04 NOTE — Progress Notes (Signed)
Pre-visit discussion using our clinic review tool. No additional management support is needed unless otherwise documented below in the visit note.  CPE- See plan.  Routine anticipatory guidance given to patient.  See health maintenance. [facility] [employer] [facility] to get more [facility].   Diet encouraged.  Discussed.   Tetanus 2009 Flu shot done at work.  Shingles shot encouraged.  Colonoscopy 2010 Prostate cancer screening and PSA options (with potential risks and benefits of testing vs not testing) were discussed along with recent recs/guidelines.  He declined testing PSA at this point. Living will prev done.  Wife designated if incapacitated.    Hypertension:    Using medication without problems or lightheadedness: yes Chest pain with exertion:no Edema:no Short of breath:no Seen at ER in 5/14.  No sx in the meantime.  This was like a combination of cold medicine and relative dehydration.  He has been more careful about hydration in the meantime.    Small L sided perirectal abscess.  Draining. Painful.   Not acute, noted for weeks at least.   PMH and SH reviewed  Meds, vitals, and allergies reviewed.   ROS: See HPI.  Otherwise negative.    GEN: nad, alert and oriented HEENT: mucous membranes moist NECK: supple w/o LA CV: rrr. PULM: ctab, no inc wob ABD: soft, +bs EXT: no edema SKIN: no acute rash but 1-2 cm draining lesion noted on the L side of the gluteal crease, doesn't appear to communicate with the rectum.  Pus expressed.  Tender. No spreading erythema.

## 2013-02-05 DIAGNOSIS — K611 Rectal abscess: Secondary | ICD-10-CM | POA: Insufficient documentation

## 2013-02-05 NOTE — Assessment & Plan Note (Signed)
Routine anticipatory guidance given to patient.  See health maintenance. [facility] [employer] [facility] to get more exercise.   Diet encouraged.  Discussed.   Tetanus 2009 Flu shot done at work.  Shingles shot encouraged.  Colonoscopy 2010 Prostate cancer screening and PSA options (with potential risks and benefits of testing vs not testing) were discussed along with recent recs/guidelines.  He declined testing PSA at this point. Living will prev done.  Wife designated if incapacitated.

## 2013-02-05 NOTE — Assessment & Plan Note (Signed)
Controlled, continue current meds.  Encouraged work on diet and exercise.  Labs d/w pt.

## 2013-02-05 NOTE — Assessment & Plan Note (Signed)
Appears to be superficial.  Already draining.  Not acute.  Would start augmentin and refer to gen surgery.  He agrees.  Okay for outpatient, non-emergent f/u.  Warm compresses in meantime.

## 2013-02-10 ENCOUNTER — Ambulatory Visit (INDEPENDENT_AMBULATORY_CARE_PROVIDER_SITE_OTHER): Payer: BC Managed Care – PPO | Admitting: General Surgery

## 2013-02-10 ENCOUNTER — Encounter: Payer: Self-pay | Admitting: General Surgery

## 2013-02-10 ENCOUNTER — Other Ambulatory Visit: Payer: Self-pay | Admitting: General Surgery

## 2013-02-10 VITALS — BP 132/86 | HR 62 | Temp 97.5°F | Resp 12 | Ht 71.0 in | Wt 219.0 lb

## 2013-02-10 DIAGNOSIS — K603 Anal fistula: Secondary | ICD-10-CM

## 2013-02-10 LAB — HEMOCCULT GUIAC POC 1CARD (OFFICE): Fecal Occult Blood, POC: NEGATIVE

## 2013-02-10 NOTE — Patient Instructions (Addendum)
The patient is aware to call back for any questions or concerns.  Anal Fistula An anal fistula is an abnormal tunnel that develops between the bowel and skin near the outside of the anus, where feces comes out. The anus has a number of tiny glands that make lubricating fluid. Sometimes these glands can become plugged and infected. This may lead to the development of a fluid-filled pocket (abscess). An anal fistula often develops after this infection or abscess. It is nearly always caused by a past or current anal abscess.  CAUSES  Though an anal fistula is almost always caused by a past or current anal abscess, other causes can include:  A complication of surgery.  Trauma to the rectal area.  Radiation to the area.  Other medical conditions or diseases, such as:   Chronic inflammatory bowel disease, such as Crohn disease or ulcerative colitis.   Colon or rectal cancer.   Diverticular disease, such as diverticulitis.   A sexually transmitted disease, such as gonorrhea, chlamydia, or syphilis.  An HIV infection or AIDS.  SYMPTOMS   Throbbing or constant pain that may be worse when sitting.   Swelling or irritation around the anus.   Drainage of pus or blood from an opening near the anus.   Pain with bowel movements.  Fever or chills. DIAGNOSIS  Your caregiver will examine the area to find the openings of the anal fistula and the fistula tract. The external opening of the anal fistula may be seen during a physical examination. Other examinations that may be performed include:   Examination of the rectal area with a gloved hand (digital rectal exam).   Examination with a probe or scope to help locate the internal opening of the fistula.   Injection of a dye into the fistula opening. X-rays can be taken to find the exact location and path of the fistula.   An MRI or ultrasound of the anal area.  Other tests may be performed to find the cause of the anal fistula.    TREATMENT  The most common treatment for an anal fistula is surgery. There are different surgery options depending on where your fistula is located and how complex the fistula is. Surgical options include:  A fistulotomy. This surgery involves opening up the whole fistula and draining the contents inside to promote healing.  Seton placement. A silk string (seton) is placed into the fistula during a fistulotomy to drain any infection to promote healing.  Advancement flap procedure. Tissue is removed from your rectum or the skin around the anus and is attached to the opening of the fistula.  Bioprosthetic plug. A cone-shaped plug is made from your tissue and is used to block the opening of the fistula. Some anal fistulas do not require surgery. A fibrin glue is a non-surgical option that involves injecting the glue to seal the fistula. You also may be prescribed an antibiotic medicine to treat an infection.  HOME CARE INSTRUCTIONS   Take your antibiotics as directed. Finish them even if you start to feel better.  Only take over-the-counter or prescription medicines as directed by your caregiver.Use a stool softener or laxative, if recommended.   Eat a high-fiber diet to help avoid constipation or as directed by your caregiver.  Drink enough water to keep your urine clear or pale yellow.   A warm sitz bath may be soothing and help with healing. You may take warm sitz baths for 15 20 minutes, 3 4 times a day  to ease pain and discomfort.   Follow excellent hygiene to keep the anal area as clean and dry as possible. Use wet toilet paper or moist towelettes after each bowel movement.  SEEK MEDICAL CARE IF: You have increased pain not controlled with medicines.  SEEK IMMEDIATE MEDICAL CARE IF:  You have severe, intolerable pain.  You have new swelling, redness, or discharge around the anal area.  You have tenderness or warmth around the anal area.  You have chills or diarrhea.  You  have severe problems urinating or having a bowel movement.   You have a fever or persistent symptoms for more than 2 3 days.   You have a fever and your symptoms suddenly get worse.  MAKE SURE YOU:   Understand these instructions.  Will watch your condition.  Will get help right away if you are not doing well or get worse. Document Released: 02/22/2008 Document Revised: 02/26/2012 Document Reviewed: 01/14/2011 [facility] Patient Information 2014 [place], [place].  Patient's surgery has been scheduled for 03-05-13 at [facility]. This patient has been asked to decrease current 325 mg aspirin to 81 mg aspirin starting one week prior to procedure.

## 2013-02-10 NOTE — Progress Notes (Signed)
Patient ID: Dalton Delacruz, male   DOB: 05/19/1950, 62 y.o.   MRN: [id number]  Chief Complaint  Patient presents with  . Other    perirectal abscess    HPI Dalton Delacruz is a 62 y.o. male.  Here today for perirectal abscess evaluation referred by Dr Para March. He states that it originally flared up about 4 months ago. Nothing that he can remember was a trigger. No episodes of constipation or diarrhea. The area has cleared up without antibiotics in the past, but has recurred frequently..  The only time he has received antibiotics was at his recent visit with his primary care physician.  States it is draining.  Pain seems to be tolerable, most prominent just before spontaneous drainage.. Bowels movements are regular. No history of trauma. Colonoscopy completed when he was living in [place]. Results not available through Care everywhere.  HPI  Past Medical History  Diagnosis Date  . Displaced spiral fracture of shaft of left humerus ~ 2002  . Hypertension     Past Surgical History  Procedure Laterality Date  . Colonoscopy      5-6 years ago  . Vasectomy      Family History  Problem Relation Age of Onset  . Cancer Mother     Breast  . Diabetes Mother   . Hyperlipidemia Mother   . Hypertension Mother   . Heart disease Mother   . Ulcers Father     stomach ulcers  . Diabetes Brother   . Prostate cancer Neg Hx   . Colon cancer Neg Hx     Social History History  Substance Use Topics  . Smoking status: Former Smoker -- 1.00 packs/day for 5 years    Types: Cigars    Quit date: 03/25/2010  . Smokeless tobacco: Never Used  . Alcohol Use: Yes     Comment: A few drinks a week    Allergies  Allergen Reactions  . Lisinopril     cough    Current Outpatient Prescriptions  Medication Sig Dispense Refill  . amoxicillin-clavulanate (AUGMENTIN) 875-125 MG per tablet Take 1 tablet by mouth 2 (two) times daily.  20 tablet  0  . aspirin EC 81 MG tablet Take 1 tablet (81 mg  total) by mouth daily.      . fish oil-omega-3 fatty acids 1000 MG capsule Take 2 g by mouth daily. 1200 mg. daily      . losartan-hydrochlorothiazide (HYZAAR) 50-12.5 MG per tablet Take 1 tablet by mouth daily.  90 tablet  3  . Multiple Vitamin (MULTIVITAMIN) tablet Take 2 tablets by mouth daily.       . NON FORMULARY ProstaMax  Take 1 tablet by mouth twice a day.       No current facility-administered medications for this visit.    Review of Systems Review of Systems  Constitutional: Negative.   Respiratory: Negative.   Cardiovascular: Negative.   Gastrointestinal: Negative for diarrhea and constipation.    Blood pressure 132/86, pulse 62, temperature 97.5 F (36.4 C), temperature source Oral, resp. rate 12, height 5\' 11"  (1.803 m), weight 219 lb (99.338 kg).  Physical Exam Physical Exam  Constitutional: He is oriented to person, place, and time. He appears well-developed and well-nourished.  Neck: Neck supple.  Cardiovascular: Normal rate, regular rhythm and normal heart sounds.   Pulmonary/Chest: Effort normal and breath sounds normal.  Abdominal: Soft. Normal appearance.  Genitourinary: Rectal exam shows fissure. Rectal exam shows anal tone normal. Guaiac negative stool. Prostate is not  enlarged and not tender.     Anal fistula  Lymphadenopathy:    He has no cervical adenopathy.       Right: No inguinal adenopathy present.       Left: No inguinal adenopathy present.  Neurological: He is alert and oriented to person, place, and time.  Skin: Skin is warm and dry.    Data Reviewed Comprehensive metabolic panel will February 01, 2013 was notable for a creatinine of 1.3. Normal electrolytes. Normal liver function studies.  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     Plan    Indications for surgical intervention were reviewed. Sigmoidoscopy in fistulotomy would be completed. Possibility of incontinence of stool or flatus was reviewed.     Patient's  surgery has been scheduled for 03-05-13 at [facility]. This patient has been asked to decrease current 325 mg aspirin to 81 mg aspirin starting one week prior to procedure.   [name] 02/10/2013, 8:34 PM

## 2013-03-04 HISTORY — PX: FISTULOTOMY: SHX6413

## 2013-03-05 ENCOUNTER — Ambulatory Visit: Payer: Self-pay | Admitting: General Surgery

## 2013-03-05 DIAGNOSIS — K602 Anal fissure, unspecified: Secondary | ICD-10-CM

## 2013-03-11 ENCOUNTER — Encounter: Payer: Self-pay | Admitting: General Surgery

## 2013-03-16 ENCOUNTER — Encounter: Payer: Self-pay | Admitting: General Surgery

## 2013-03-16 ENCOUNTER — Ambulatory Visit (INDEPENDENT_AMBULATORY_CARE_PROVIDER_SITE_OTHER): Payer: BC Managed Care – PPO | Admitting: General Surgery

## 2013-03-16 VITALS — BP 130/76 | HR 78 | Resp 14 | Ht 71.0 in | Wt 221.0 lb

## 2013-03-16 DIAGNOSIS — K603 Anal fistula: Secondary | ICD-10-CM

## 2013-03-16 NOTE — Patient Instructions (Signed)
Patient to return in one month. 

## 2013-03-16 NOTE — Progress Notes (Signed)
Patient ID: Dalton Delacruz, male   DOB: January 02, 1951, 62 y.o.   MRN: [id number]  Chief Complaint  Patient presents with  . Routine Post Op    fisulatomy    HPI Dalton Delacruz is a 62 y.o. male here today for his post op Sigmoidoscopy/Anal Fistulotomy done 03/04/13. Patient states he is still having some drainage, rarely appreciates a drop of blood with perineural cleansing. No significant pain.  The patient reports no difficulty with control of stool or flatus.        HPI  Past Medical History  Diagnosis Date  . Displaced spiral fracture of shaft of left humerus ~ 2002  . Hypertension     Past Surgical History  Procedure Laterality Date  . Colonoscopy      5-6 years ago  . Vasectomy    . Fistulotomy  03/04/13    Family History  Problem Relation Age of Onset  . Cancer Mother     Breast  . Diabetes Mother   . Hyperlipidemia Mother   . Hypertension Mother   . Heart disease Mother   . Ulcers Father     stomach ulcers  . Diabetes Brother   . Prostate cancer Neg Hx   . Colon cancer Neg Hx     Social History History  Substance Use Topics  . Smoking status: Former Smoker -- 1.00 packs/day for 5 years    Types: Cigars    Quit date: 03/25/2010  . Smokeless tobacco: Never Used  . Alcohol Use: Yes     Comment: A few drinks a week    Allergies  Allergen Reactions  . Lisinopril     cough    Current Outpatient Prescriptions  Medication Sig Dispense Refill  . amoxicillin-clavulanate (AUGMENTIN) 875-125 MG per tablet Take 1 tablet by mouth 2 (two) times daily.  20 tablet  0  . aspirin EC 81 MG tablet Take 1 tablet (81 mg total) by mouth daily.      . fish oil-omega-3 fatty acids 1000 MG capsule Take 2 g by mouth daily. 1200 mg. daily      . losartan-hydrochlorothiazide (HYZAAR) 50-12.5 MG per tablet Take 1 tablet by mouth daily.  90 tablet  3  . Multiple Vitamin (MULTIVITAMIN) tablet Take 2 tablets by mouth daily.       . NON FORMULARY ProstaMax  Take 1 tablet by  mouth twice a day.       No current facility-administered medications for this visit.    Review of Systems Review of Systems  Constitutional: Negative.   Respiratory: Negative.   Cardiovascular: Negative.     Blood pressure 130/76, pulse 78, resp. rate 14, height 5\' 11"  (1.803 m), weight 221 lb (100.245 kg).  Physical Exam Physical Exam  Constitutional: He is oriented to person, place, and time. He appears well-developed and well-nourished.  Genitourinary:     Neurological: He is alert and oriented to person, place, and time.  Skin: Skin is warm and dry.      Assessment    Doing well status post fistulotomy. Normal sigmoidoscopy.     Plan    The persistent drainage as expected considering the degree of tissue debrided. I anticipate the drainage will resolve in the next few weeks.  Followup examination in one month, earlier if needed.        Dalton Delacruz 03/16/2013, 9:06 PM

## 2013-04-16 ENCOUNTER — Ambulatory Visit (INDEPENDENT_AMBULATORY_CARE_PROVIDER_SITE_OTHER): Payer: BC Managed Care – PPO | Admitting: Family Medicine

## 2013-04-16 ENCOUNTER — Telehealth: Payer: Self-pay

## 2013-04-16 ENCOUNTER — Encounter: Payer: Self-pay | Admitting: Family Medicine

## 2013-04-16 VITALS — BP 106/76 | HR 68 | Temp 98.1°F | Wt 222.0 lb

## 2013-04-16 DIAGNOSIS — N50819 Testicular pain, unspecified: Secondary | ICD-10-CM

## 2013-04-16 DIAGNOSIS — N509 Disorder of male genital organs, unspecified: Secondary | ICD-10-CM

## 2013-04-16 NOTE — Progress Notes (Signed)
Pre-visit discussion using our clinic review tool. No additional management support is needed unless otherwise documented below in the visit note.  R testicle is sore and aching, swollen.  No L sided sx.  Going for about 3 weeks.  No lump or bulge.    Irritation at the tip of the penis.  Red, sore, more irritated after intercourse.  Not condoms.  No burning with urination.    No fevers.   Meds, vitals, and allergies reviewed.   ROS: See HPI.  Otherwise, noncontributory.  nad Testes bilaterally descended with normal exam on the L but R testicle sore and slightly enlarged. No urethral discharge but an irritated area on the glans c/w a small area of fungal infection

## 2013-04-16 NOTE — Patient Instructions (Signed)
Dalton Delacruz will call about your referral- see her on the way out.  Use an OTC antifungal cream in the meantime.  Take care.

## 2013-04-16 NOTE — Telephone Encounter (Signed)
Dalton Delacruz went to pharmacy to get OTC antifungal cream and brought Clotrimazole 1% to office to verify by physician that this is correct cream. Advised Dalton Delacruz that was an antifungal cream; Dalton Delacruz said they were leaving for [facility] tomorrow and wanted verified by doctor. Dr Damita Dunnings not available and Dr Darnell Level said Clotrimazole should be OK.

## 2013-04-18 DIAGNOSIS — N50819 Testicular pain, unspecified: Secondary | ICD-10-CM | POA: Insufficient documentation

## 2013-04-18 NOTE — Assessment & Plan Note (Signed)
D/w pt.  Not acute. Will check u/s.  Would expect benign lesion, ie spermatocele or hydrocele.  No hernia on exam.  Would use OTC topical antifungal on the glans.  F/u prn.  He agrees.

## 2013-04-19 ENCOUNTER — Ambulatory Visit: Payer: BC Managed Care – PPO | Admitting: General Surgery

## 2013-04-26 ENCOUNTER — Ambulatory Visit: Payer: Self-pay | Admitting: Family Medicine

## 2013-04-27 ENCOUNTER — Encounter: Payer: Self-pay | Admitting: Family Medicine

## 2013-04-27 ENCOUNTER — Encounter: Payer: Self-pay | Admitting: General Surgery

## 2013-04-27 ENCOUNTER — Ambulatory Visit (INDEPENDENT_AMBULATORY_CARE_PROVIDER_SITE_OTHER): Payer: BC Managed Care – PPO | Admitting: General Surgery

## 2013-04-27 VITALS — BP 130/82 | HR 64 | Resp 12 | Ht 71.0 in | Wt 223.0 lb

## 2013-04-27 DIAGNOSIS — K603 Anal fistula, unspecified: Secondary | ICD-10-CM

## 2013-04-27 NOTE — Patient Instructions (Addendum)
Patient to return as needed. 

## 2013-04-27 NOTE — Progress Notes (Signed)
Patient ID: Dalton Delacruz, male   DOB: Dec 19, 1950, 63 y.o.   MRN: [id number]  Chief Complaint  Patient presents with  . Routine Post Op    fistulotomy    HPI Dalton Delacruz is a 63 y.o. male here today for his post op fistulotomy done one 03/05/13. Patient states he is doing well . No new problems at this time. Bowels move daily with no blood noted. HPI  Past Medical History  Diagnosis Date  . Displaced spiral fracture of shaft of left humerus ~ 2002  . Hypertension     Past Surgical History  Procedure Laterality Date  . Colonoscopy      5-6 years ago  . Vasectomy    . Fistulotomy  03/04/13    Family History  Problem Relation Age of Onset  . Cancer Mother     Breast  . Diabetes Mother   . Hyperlipidemia Mother   . Hypertension Mother   . Heart disease Mother   . Ulcers Father     stomach ulcers  . Diabetes Brother   . Prostate cancer Neg Hx   . Colon cancer Neg Hx     Social History History  Substance Use Topics  . Smoking status: Former Smoker -- 1.00 packs/day for 5 years    Types: Cigars    Quit date: 03/25/2010  . Smokeless tobacco: Never Used  . Alcohol Use: Yes     Comment: A few drinks a week    Allergies  Allergen Reactions  . Lisinopril     cough    Current Outpatient Prescriptions  Medication Sig Dispense Refill  . aspirin EC 81 MG tablet Take 1 tablet (81 mg total) by mouth daily.      . fish oil-omega-3 fatty acids 1000 MG capsule Take 2 g by mouth daily. 1200 mg. daily      . losartan-hydrochlorothiazide (HYZAAR) 50-12.5 MG per tablet Take 1 tablet by mouth daily.  90 tablet  3  . Multiple Vitamin (MULTIVITAMIN) tablet Take 2 tablets by mouth daily.       . NON FORMULARY ProstaMax  Take 1 tablet by mouth twice a day.       No current facility-administered medications for this visit.    Review of Systems Review of Systems  Constitutional: Negative.   Respiratory: Negative.   Cardiovascular: Negative.     Blood pressure 130/82,  pulse 64, resp. rate 12, height 5\' 11"  (1.803 m), weight 223 lb (101.152 kg).  Physical Exam Physical Exam  Constitutional: He is oriented to person, place, and time. He appears well-developed and well-nourished.  Genitourinary:  fistulotomy site well healed  Neurological: He is alert and oriented to person, place, and time.  Skin: Skin is warm and dry.   Assessment    Doing well status post anal fistulotomy.     Plan    Patient is to return on an as needed basis.       Robert Bellow 04/28/2013, 8:51 PM

## 2014-01-30 ENCOUNTER — Other Ambulatory Visit: Payer: Self-pay | Admitting: Family Medicine

## 2014-01-30 DIAGNOSIS — I10 Essential (primary) hypertension: Secondary | ICD-10-CM

## 2014-02-03 ENCOUNTER — Other Ambulatory Visit (INDEPENDENT_AMBULATORY_CARE_PROVIDER_SITE_OTHER): Payer: Managed Care, Other (non HMO)

## 2014-02-03 DIAGNOSIS — I10 Essential (primary) hypertension: Secondary | ICD-10-CM

## 2014-02-03 LAB — COMPREHENSIVE METABOLIC PANEL
ALK PHOS: 60 U/L (ref 39–117)
ALT: 34 U/L (ref 0–53)
AST: 22 U/L (ref 0–37)
Albumin: 3.4 g/dL — ABNORMAL LOW (ref 3.5–5.2)
BUN: 19 mg/dL (ref 6–23)
CALCIUM: 9 mg/dL (ref 8.4–10.5)
CHLORIDE: 106 meq/L (ref 96–112)
CO2: 28 mEq/L (ref 19–32)
Creatinine, Ser: 1.3 mg/dL (ref 0.4–1.5)
GFR: 58.86 mL/min — ABNORMAL LOW (ref >60.00)
Glucose, Bld: 105 mg/dL — ABNORMAL HIGH (ref 70–99)
POTASSIUM: 4.1 meq/L (ref 3.5–5.1)
SODIUM: 139 meq/L (ref 135–145)
TOTAL PROTEIN: 6.7 g/dL (ref 6.0–8.3)
Total Bilirubin: 0.8 mg/dL (ref 0.2–1.2)

## 2014-02-03 LAB — LIPID PANEL
Cholesterol: 210 mg/dL — ABNORMAL HIGH (ref 0–200)
HDL: 39.7 mg/dL (ref >39.00)
LDL CALC: 154 mg/dL — AB (ref 0–99)
NONHDL: 170.3
TRIGLYCERIDES: 80 mg/dL (ref 0.0–149.0)
Total CHOL/HDL Ratio: 5
VLDL: 16 mg/dL (ref 0.0–40.0)

## 2014-02-10 ENCOUNTER — Encounter: Payer: Self-pay | Admitting: Family Medicine

## 2014-02-10 ENCOUNTER — Ambulatory Visit (INDEPENDENT_AMBULATORY_CARE_PROVIDER_SITE_OTHER): Payer: Managed Care, Other (non HMO) | Admitting: Family Medicine

## 2014-02-10 VITALS — BP 122/80 | HR 68 | Temp 98.1°F | Ht 71.5 in | Wt 218.0 lb

## 2014-02-10 DIAGNOSIS — Z0000 Encounter for general adult medical examination without abnormal findings: Secondary | ICD-10-CM

## 2014-02-10 DIAGNOSIS — Z7189 Other specified counseling: Secondary | ICD-10-CM | POA: Insufficient documentation

## 2014-02-10 DIAGNOSIS — R399 Unspecified symptoms and signs involving the genitourinary system: Secondary | ICD-10-CM | POA: Insufficient documentation

## 2014-02-10 DIAGNOSIS — I10 Essential (primary) hypertension: Secondary | ICD-10-CM

## 2014-02-10 MED ORDER — LOSARTAN POTASSIUM-HCTZ 50-12.5 MG PO TABS: 1.0000 | ORAL_TABLET | Freq: Every day | ORAL | Status: DC

## 2014-02-10 NOTE — Patient Instructions (Addendum)
Check with your insurance to see if they will cover the shingles shot. Take care. Keep exercising.  I would limit your nighttime fluid intake and see if that helps with your overnight symptoms.  Call me if needed.

## 2014-02-10 NOTE — Assessment & Plan Note (Signed)
Routine anticipatory guidance given to patient. See health maintenance. [employer] [employer] [facility].  He is making himself go to the [employer], especially with his work schedule.   Diet encouraged. Discussed, work is the main impediment, and he is working on that.  Tetanus 2009 Flu shot done at work, done 6 weeks ago.  Shingles shot encouraged.  Colonoscopy 2010.   Prostate cancer screening and PSA options (with potential risks and benefits of testing vs not testing) were discussed along with recent recs/guidelines.  He declined testing PSA at this point. Living will prev done. Wife designated if incapacitated.

## 2014-02-10 NOTE — Assessment & Plan Note (Signed)
Controlled, continue as is with med.  Continue work on weight with exercise, labs d/w pt.  Mild inc in sugar and LDL >130 d/w pt.   He'll continue work on diet/weight and recheck yearly.

## 2014-02-10 NOTE — Assessment & Plan Note (Signed)
Not bothersome enough to take meds. Prev PSA wnl.  He'll cut back on nighttime fluids and notify me if needed.  He agrees.

## 2014-02-10 NOTE — Progress Notes (Signed)
Pre visit review using our clinic review tool, if applicable. No additional management support is needed unless otherwise documented below in the visit note.  CPE- See plan. Routine anticipatory guidance given to patient. See health maintenance. [facility] the [employer] to get more [facility].  He is making himself go to the [employer], especially with his work schedule.   Diet encouraged. Discussed, work is the main impediment, and he is working on that.  Tetanus 2009 Flu shot done at work, done 6 weeks ago.  Shingles shot encouraged.  Colonoscopy 2010.   Prostate cancer screening and PSA options (with potential risks and benefits of testing vs not testing) were discussed along with recent recs/guidelines.  He declined testing PSA at this point. Living will prev done. Wife designated if incapacitated.  He has some mild nocturia, 1-2x/night, not bothersome enough to take meds.  No burning with urination but has had some gradual slowing of his stream.   Hypertension:  Using medication without problems or lightheadedness: yes Chest pain with exertion:no Edema:no Short of breath:no  PMH and SH reviewed  Meds, vitals, and allergies reviewed.   ROS: See HPI. Otherwise negative.   GEN: nad, alert and oriented HEENT: mucous membranes moist NECK: supple w/o LA CV: rrr.  no murmur PULM: ctab, no inc wob ABD: soft, +bs EXT: no edema SKIN: no acute rash but he has a small macule noted on the L medial shin and another on the R lower abd wall (he has f/u with derm pending)

## 2014-02-11 ENCOUNTER — Telehealth: Payer: Self-pay | Admitting: Family Medicine

## 2014-02-11 NOTE — Telephone Encounter (Signed)
emmi emailed °

## 2014-04-15 ENCOUNTER — Ambulatory Visit: Payer: Managed Care, Other (non HMO)

## 2014-04-22 ENCOUNTER — Ambulatory Visit: Payer: Managed Care, Other (non HMO)

## 2014-04-27 ENCOUNTER — Ambulatory Visit: Payer: Managed Care, Other (non HMO)

## 2014-06-21 ENCOUNTER — Telehealth: Payer: Self-pay | Admitting: Family Medicine

## 2014-06-21 NOTE — Telephone Encounter (Signed)
Spouse called to schedule a shingles shot Is it ok schedule?  This week

## 2014-06-22 NOTE — Telephone Encounter (Signed)
Patient's wife notified as instructed by telephone. Offered patient's wife appointment times on Friday, which she stated that she will have to call her husband and see when he can make an appointment. Ms. Fitzsimmons stated that she will call back to get this scheduled after she talks with her husband.

## 2014-06-22 NOTE — Telephone Encounter (Signed)
Please get him scheduled.  Thanks!

## 2014-06-24 ENCOUNTER — Ambulatory Visit (INDEPENDENT_AMBULATORY_CARE_PROVIDER_SITE_OTHER): Payer: Managed Care, Other (non HMO) | Admitting: *Deleted

## 2014-06-24 DIAGNOSIS — Z23 Encounter for immunization: Secondary | ICD-10-CM | POA: Diagnosis not present

## 2014-07-15 NOTE — Op Note (Signed)
PATIENT NAME:  Dalton Delacruz, Dalton Delacruz MR#:  [id number] DATE OF BIRTH:  06/16/1950  DATE OF PROCEDURE:  03/05/2013  PREOPERATIVE DIAGNOSIS: Anal fistula.   POSTOPERATIVE DIAGNOSIS: Anal fistula.   OPERATIVE PROCEDURE:  1.  Sigmoidoscopy.  2.  Anal fistulotomy.   SURGEON: Hervey Ard, MD  ANESTHESIA: General by LMA under Dr. [place] Service, Marcaine 0.5% with 1:200,000 units epinephrine.   ESTIMATED BLOOD LOSS: Minimal.   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 He was felt to be a candidate for fistulotomy.   OPERATIVE NOTE: With the patient under adequate general anesthesia, he was placed in the dorsal lithotomy position. The perineum was prepped with Betadine solution and draped. Marcaine was infiltrated for postoperative analgesia. Rigid sigmoidoscopy is completed to approximately 18 cm at which point formed stool prevented further passage. No rectal lesions were noted.   Digital rectal exam shows slight thickening at the 12 o'clock position. A lacrimal duct probe was inserted into the fistular opening at the 2 o'clock position in this tract and superiorly to the 12 o'clock position following [place] Rule. This area was incised sharply and the chronic inflammatory tissue removed with a curette. Hemostasis was with electrocautery. The wound was covered with gauze and the patient taken to the recovery room in stable condition.    ____________________________ Robert Bellow, MD jwb:np D: 03/05/2013 21:00:29 ET T: 03/05/2013 22:14:12 ET JOB#: [id number]  cc: Robert Bellow, MD, <Dictator> Suszanne Conners, MD Giovanni Biby Amedeo Kinsman MD ELECTRONICALLY SIGNED 03/07/2013 9:40

## 2014-07-19 ENCOUNTER — Emergency Department
Admit: 2014-07-19 | Disposition: A | Discharge status: Home / Self Care | Payer: Self-pay | Admitting: Emergency Medicine

## 2014-07-19 LAB — COMPREHENSIVE METABOLIC PANEL
ALBUMIN: 4.2 g/dL
AST: 26 U/L
Alkaline Phosphatase: 58 U/L
Anion Gap: 6 — ABNORMAL LOW (ref 7–16)
BUN: 20 mg/dL
Bilirubin,Total: 0.6 mg/dL
Calcium, Total: 9.1 mg/dL
Chloride: 103 mmol/L
Co2: 29 mmol/L
Creatinine: 1.05 mg/dL
EGFR (Non-African Amer.): >60
Glucose: 113 mg/dL — ABNORMAL HIGH
Potassium: 4.2 mmol/L
SGPT (ALT): 28 U/L
Sodium: 138 mmol/L
TOTAL PROTEIN: 7.2 g/dL

## 2014-07-19 LAB — CBC WITH DIFFERENTIAL/PLATELET
BASOS PCT: 0.4 %
Basophil #: 0 10*3/uL (ref 0.0–0.1)
EOS ABS: 0.1 10*3/uL (ref 0.0–0.7)
Eosinophil %: 1.1 %
HCT: 43.7 % (ref 40.0–52.0)
HGB: 14.7 g/dL (ref 13.0–18.0)
LYMPHS PCT: 16.4 %
Lymphocyte #: 1.4 10*3/uL (ref 1.0–3.6)
MCH: 31.5 pg (ref 26.0–34.0)
MCHC: 33.6 g/dL (ref 32.0–36.0)
MCV: 94 fL (ref 80–100)
MONOS PCT: 12.6 %
Monocyte #: 1.1 x10 3/mm — ABNORMAL HIGH (ref 0.2–1.0)
Neutrophil #: 6.1 10*3/uL (ref 1.4–6.5)
Neutrophil %: 69.5 %
Platelet: 263 10*3/uL (ref 150–440)
RBC: 4.66 10*6/uL (ref 4.40–5.90)
RDW: 12.5 % (ref 11.5–14.5)
WBC: 8.7 10*3/uL (ref 3.8–10.6)

## 2014-07-19 LAB — PROTIME-INR
INR: 1
Prothrombin Time: 13.1 secs

## 2014-07-19 LAB — TROPONIN I: Troponin-I: <0.03 ng/mL

## 2014-07-19 LAB — APTT: Activated PTT: 30.5 secs (ref 23.6–35.9)

## 2014-08-08 ENCOUNTER — Encounter: Payer: Self-pay | Admitting: Family Medicine

## 2014-08-08 ENCOUNTER — Ambulatory Visit (INDEPENDENT_AMBULATORY_CARE_PROVIDER_SITE_OTHER): Payer: Managed Care, Other (non HMO) | Admitting: Family Medicine

## 2014-08-08 VITALS — BP 104/68 | HR 65 | Temp 97.6°F | Wt 213.5 lb

## 2014-08-08 DIAGNOSIS — R5383 Other fatigue: Secondary | ICD-10-CM | POA: Diagnosis not present

## 2014-08-08 DIAGNOSIS — R0602 Shortness of breath: Secondary | ICD-10-CM

## 2014-08-08 LAB — TSH: TSH: 1.51 u[IU]/mL (ref 0.35–4.50)

## 2014-08-08 LAB — BRAIN NATRIURETIC PEPTIDE: PRO B NATRI PEPTIDE: 20 pg/mL (ref 0.0–100.0)

## 2014-08-08 LAB — TESTOSTERONE: Testosterone: 779.65 ng/dL (ref 300.00–890.00)

## 2014-08-08 MED ORDER — LOSARTAN POTASSIUM-HCTZ 50-12.5 MG PO TABS: 0.5000 | ORAL_TABLET | Freq: Every day | ORAL | Status: DC

## 2014-08-08 NOTE — Progress Notes (Signed)
Pre visit review using our clinic review tool, if applicable. No additional management support is needed unless otherwise documented below in the visit note.  Was in [place] for work a few days before ER visit.  He felt dizzy and not coordinated.  The room didn't spin.  He felt clumsy and his legs felt cold.  No focal neuro changes.  He felt better the day (after getting back) except for fatigue.  He got worse again overall, similar to [place] episode, and went to the ER.  He had to walk with his hand on the wall to keep his balance (that was happening mult times).  Was seen in ER 07/19/14.  Seen at ER, unremarkable w/u.  CT head neg.  He still feels "foggy" and "not sharp".  Still really fatigued.  His concentration and attention are both lower than normal.  This is all atypical.    Father has hypertrophic cardiomyopathy.  No FCNAVD.  He can get a little SOB with the worse episode but still sleeping on 1 pillow.  No tick bites known.  No other travel other than [place] and [place], along with a day trip to [place].  No rash.  No blood in stool.    He has been working to lose weight intentionally.  No new meds started or stopped.  Compliant with meds.    Meds, vitals, and allergies reviewed.   ROS: See HPI.  Otherwise, noncontributory.  GEN: nad, alert and oriented HEENT: mucous membranes moist, TM wnl, nasal and OP exam wnl NECK: supple w/o LA, no tmg, no bruit CV: rrr.  no murmur PULM: ctab, no inc wob ABD: soft, +bs EXT: no edema SKIN: no rash CN 2-12 wnl B, S/S/DTR wnl x4

## 2014-08-08 NOTE — Patient Instructions (Signed)
Skip your BP medicine tomorrow.   Starting 08/10/14 start taking 1/2 tab a day.   Update me about how you feel and your BP in a few days.  It will take about 5 days for the medicine to level off at the new dose.  Go to the lab on the way out.  We'll contact you with your lab report. Take care. Glad to see you.

## 2014-08-10 NOTE — Assessment & Plan Note (Signed)
See notes on labs.  All of this may be from overtreated HTN.  D/w.  Labs reassuring, will cut dose of BP med and he'll update.  ER notes reviewed.  Okay for outpatient f/u.  >25 minutes spent in face to face time with patient, >50% spent in counselling or coordination of care.

## 2014-08-16 ENCOUNTER — Telehealth: Payer: Self-pay

## 2014-08-16 NOTE — Telephone Encounter (Signed)
Pt left v/m requesting cb; left v/m returning call and requesting cb.

## 2014-08-16 NOTE — Telephone Encounter (Signed)
Patient notified as instructed by telephone and verbalized understanding. Patient stated that he will monitor things and will be back in touch if he has any problems.

## 2014-08-16 NOTE — Telephone Encounter (Signed)
Pt called back and BP is remaining in pts normal level now mid 120s/80-90. pts fatigue,attention and concentration levels are all much better.  Pt is still taking losartan HCTZ 1/2 tab daily; pt wants to know if should continue taking the 1/2 tab. Pt also said at times he has problems with dehydration (not having a problem now) and wants to know if should be taking the losartan HCTZ since the HCTZ might effect the periods of dehydration. Pt request cb. [facility].

## 2014-08-16 NOTE — Telephone Encounter (Addendum)
Pt left v/m requesting cb to update pt condition. Called pt and he is in a meeting and will cb when available.

## 2014-08-16 NOTE — Telephone Encounter (Signed)
If his BP is good and he is feeling well, then I would continue as is with 1/2 tab a day.   Glad he is better.   With either BP med, dehydration is a problem.  Given his recent symptoms, I would stay on 1/2 tab a day and try to stay well hydrated.  If he had a GI illness with profuse diarrhea or other known volume loss, then occ holding 1-2 doses of BP med would be reasonable.  See how he does with that.  Thanks.

## 2014-11-21 ENCOUNTER — Ambulatory Visit (INDEPENDENT_AMBULATORY_CARE_PROVIDER_SITE_OTHER): Payer: Managed Care, Other (non HMO) | Admitting: Family Medicine

## 2014-11-21 ENCOUNTER — Encounter: Payer: Self-pay | Admitting: Family Medicine

## 2014-11-21 ENCOUNTER — Ambulatory Visit (INDEPENDENT_AMBULATORY_CARE_PROVIDER_SITE_OTHER)
Admission: RE | Admit: 2014-11-21 | Discharge: 2014-11-21 | Disposition: A | Discharge status: Home / Self Care | Payer: Managed Care, Other (non HMO) | Source: Ambulatory Visit | Attending: Family Medicine | Admitting: Family Medicine

## 2014-11-21 VITALS — BP 142/84 | HR 61 | Temp 98.3°F | Wt 220.8 lb

## 2014-11-21 DIAGNOSIS — R05 Cough: Secondary | ICD-10-CM

## 2014-11-21 DIAGNOSIS — R059 Cough, unspecified: Secondary | ICD-10-CM

## 2014-11-21 MED ORDER — OMEPRAZOLE MAGNESIUM 20 MG PO TBEC: 20.0000 mg | DELAYED_RELEASE_TABLET | Freq: Every day | ORAL | Status: DC

## 2014-11-21 NOTE — Progress Notes (Signed)
Pre visit review using our clinic review tool, if applicable. No additional management support is needed unless otherwise documented below in the visit note.  Travelled to [place] in July.  He got the cough at that point.  He didn't feel unwell.  No trigger known at that point.  He only had the cough by itself.  Not better or worse in the meantime, but continues.  Has been travelling in [place] recently.  No FCNAVD in the meantime.  No sputum usually except for occ clear sputum, rarely scantly yellow. intermittent cough in the meantime. Seems to be worse with drinking something cold, ie that triggers it.    Prev ACE cough had resolved.  On ARB.   No heartburn usually, occ but not worse than rare baseline.  No vomiting.  No wheeze.  No nasal sx.  The cough seems to originate in the throat, upper airway.  No blood in sputum.   Meds, vitals, and allergies reviewed.   ROS: See HPI.  Otherwise, noncontributory.  GEN: nad, alert and oriented HEENT: mucous membranes moist, OP and nasal exam wnl NECK: supple w/o LA CV: rrr.  PULM: ctab, no inc wob ABD: soft, +bs EXT: no edema  CXR unremarkable.

## 2014-11-21 NOTE — Patient Instructions (Signed)
This could be from GERD/irritation, less likely from BP meds.  Go to the lab on the way out.  We'll contact you with your xary report. Try taking prilosec OTC, 20mg  a day.   If not better, then let me know.

## 2014-11-22 DIAGNOSIS — R05 Cough: Secondary | ICD-10-CM | POA: Insufficient documentation

## 2014-11-22 DIAGNOSIS — R059 Cough, unspecified: Secondary | ICD-10-CM | POA: Insufficient documentation

## 2014-11-22 NOTE — Assessment & Plan Note (Signed)
Chronic cough ddx d/w pt.   GERD likely.   Not on ACE.  ARB unlikely to cause.   No h/o asthma, COPD.   No sig postnasal sx.  I reviewed CXR, No active cardiopulmonary disease. Start PPI, update me if not better.   No alarming sx.  Okay for outpatient f/u.  Likely not infectious.  Wouldn't start empiric abx.

## 2015-01-09 ENCOUNTER — Encounter: Payer: Self-pay | Admitting: Family Medicine

## 2015-01-09 ENCOUNTER — Ambulatory Visit (INDEPENDENT_AMBULATORY_CARE_PROVIDER_SITE_OTHER): Payer: Managed Care, Other (non HMO) | Admitting: Family Medicine

## 2015-01-09 VITALS — BP 124/78 | HR 74 | Temp 98.3°F | Wt 220.2 lb

## 2015-01-09 DIAGNOSIS — R05 Cough: Secondary | ICD-10-CM

## 2015-01-09 DIAGNOSIS — R059 Cough, unspecified: Secondary | ICD-10-CM

## 2015-01-09 NOTE — Progress Notes (Signed)
Pre visit review using our clinic review tool, if applicable. No additional management support is needed unless otherwise documented below in the visit note.  Still with cough.  No help with PPI.  Stopped PPI in meantime.  Still with intermittent cough, some occ sputum, more yellowish now then prev.  No fevers.  Some rare post nasal gtt.  No change with travel to [place].  Still on ARB.  Drinking a cold drink will still trigger the cough.  Moving from sitting to laying down will trigger the cough.  He notes some improvement with sleeping on a bigger pillow.  He'll have a bad coughing fit at night right when he lays down, then can sleep for a while.  He tried taking an expectorant.    Meds, vitals, and allergies reviewed.   ROS: See HPI.  Otherwise, noncontributory.  GEN: nad, alert and oriented HEENT: mucous membranes moist, tm wnl, nasal and OP exam wnl NECK: supple w/o LA CV: rrr.  PULM: ctab, no inc wob EXT: no edema

## 2015-01-09 NOTE — Patient Instructions (Signed)
Dalton Delacruz will call about your referral. Don't change your meds for now.   Take care.  Glad to see you.

## 2015-01-10 NOTE — Assessment & Plan Note (Signed)
It does seem to be originating from his pharynx or larynx (with laying down, with eating/drinking cold food), but I don't see any source on exam.  D/w pt.  Prev CXR neg.  Would refer to ENT for consideration of direct exam.  He agrees.  Still okay for outpatient f/u and chest is still clear.  Noted that PPI didn't help prev.

## 2015-02-05 ENCOUNTER — Other Ambulatory Visit: Payer: Self-pay | Admitting: Family Medicine

## 2015-02-05 DIAGNOSIS — I10 Essential (primary) hypertension: Secondary | ICD-10-CM

## 2015-02-08 ENCOUNTER — Other Ambulatory Visit (INDEPENDENT_AMBULATORY_CARE_PROVIDER_SITE_OTHER): Payer: Managed Care, Other (non HMO)

## 2015-02-08 DIAGNOSIS — I10 Essential (primary) hypertension: Secondary | ICD-10-CM | POA: Diagnosis not present

## 2015-02-08 LAB — COMPREHENSIVE METABOLIC PANEL
ALBUMIN: 4 g/dL (ref 3.5–5.2)
ALT: 30 U/L (ref 0–53)
AST: 20 U/L (ref 0–37)
Alkaline Phosphatase: 58 U/L (ref 39–117)
BILIRUBIN TOTAL: 0.5 mg/dL (ref 0.2–1.2)
BUN: 18 mg/dL (ref 6–23)
CALCIUM: 9 mg/dL (ref 8.4–10.5)
CO2: 28 meq/L (ref 19–32)
CREATININE: 1.18 mg/dL (ref 0.40–1.50)
Chloride: 105 mEq/L (ref 96–112)
GFR: 66.19 mL/min (ref >60.00)
Glucose, Bld: 100 mg/dL — ABNORMAL HIGH (ref 70–99)
Potassium: 4.2 mEq/L (ref 3.5–5.1)
Sodium: 139 mEq/L (ref 135–145)
Total Protein: 6.7 g/dL (ref 6.0–8.3)

## 2015-02-08 LAB — LIPID PANEL
CHOL/HDL RATIO: 4
CHOLESTEROL: 197 mg/dL (ref 0–200)
HDL: 49.7 mg/dL (ref >39.00)
LDL Cholesterol: 136 mg/dL — ABNORMAL HIGH (ref 0–99)
NonHDL: 147.28
TRIGLYCERIDES: 58 mg/dL (ref 0.0–149.0)
VLDL: 11.6 mg/dL (ref 0.0–40.0)

## 2015-02-09 ENCOUNTER — Telehealth: Payer: Self-pay | Admitting: Family Medicine

## 2015-02-09 NOTE — Telephone Encounter (Signed)
I would still do the CPE.  If he needs to reschedule, then I understand that.  I wouldn't cancel totally.  There are still health maintenance issues worth discussing.

## 2015-02-09 NOTE — Telephone Encounter (Signed)
Pt wanted to know if he really needed to keep CPE appointment on 02/14/2015.  Explained that PCP did like to have Annual CPE.  He questioned the need for it.  Wanted to ask Dr. Damita Dunnings if though necessary.  Pt requests call back at [phone] / lt

## 2015-02-09 NOTE — Telephone Encounter (Signed)
Appointment rescheduled.

## 2015-02-14 ENCOUNTER — Encounter: Payer: Managed Care, Other (non HMO) | Admitting: Family Medicine

## 2015-04-21 ENCOUNTER — Encounter: Payer: Self-pay | Admitting: Family Medicine

## 2015-04-21 ENCOUNTER — Ambulatory Visit (INDEPENDENT_AMBULATORY_CARE_PROVIDER_SITE_OTHER): Payer: Managed Care, Other (non HMO) | Admitting: Family Medicine

## 2015-04-21 VITALS — BP 104/70 | HR 60 | Temp 97.9°F | Ht 72.0 in | Wt 222.8 lb

## 2015-04-21 DIAGNOSIS — R059 Cough, unspecified: Secondary | ICD-10-CM

## 2015-04-21 DIAGNOSIS — I10 Essential (primary) hypertension: Secondary | ICD-10-CM

## 2015-04-21 DIAGNOSIS — Z119 Encounter for screening for infectious and parasitic diseases, unspecified: Secondary | ICD-10-CM

## 2015-04-21 DIAGNOSIS — R399 Unspecified symptoms and signs involving the genitourinary system: Secondary | ICD-10-CM

## 2015-04-21 DIAGNOSIS — R05 Cough: Secondary | ICD-10-CM

## 2015-04-21 DIAGNOSIS — Z0000 Encounter for general adult medical examination without abnormal findings: Secondary | ICD-10-CM

## 2015-04-21 MED ORDER — LOSARTAN POTASSIUM-HCTZ 50-12.5 MG PO TABS: 0.5000 | ORAL_TABLET | Freq: Every day | ORAL | Status: DC

## 2015-04-21 NOTE — Patient Instructions (Signed)
Don't change your meds for now.  Take care.  Glad to see you. 

## 2015-04-21 NOTE — Progress Notes (Signed)
Pre visit review using our clinic review tool, if applicable. No additional management support is needed unless otherwise documented below in the visit note.  CPE- See plan.  Routine anticipatory guidance given to patient.  See health maintenance. Diet encouraged. Discussed, work is the main impediment, and he is working on that.  Tetanus 2009 Flu shot done ~01/2015 Shingles shot prev done Colonoscopy 2010  Prostate cancer screening and PSA options (with potential risks and benefits of testing vs not testing) were discussed along with recent recs/guidelines.  He declined testing PSA at this point. Living will prev done. Wife designated if incapacitated. Pt opts in for HCV screening.  D/w pt re: routine screening.   HIV declined, d/w pt.   Hypertension:    Using medication without problems or lightheadedness: yes Chest pain with exertion:no Edema:no Short of breath:no  Cough resolved with PPI use.  Had seen ENT prev.    Still with some urinary urgency.  It happens about 60% of the time.  No burning with urination.  2.5 cups of coffee a day.  It isn't bothersome enough to do anything about at this point.   PMH and SH reviewed  Meds, vitals, and allergies reviewed.   ROS: See HPI.  Otherwise negative.    GEN: nad, alert and oriented HEENT: mucous membranes moist NECK: supple w/o LA CV: rrr. PULM: ctab, no inc wob ABD: soft, +bs EXT: no edema SKIN: no acute rash

## 2015-04-23 NOTE — Assessment & Plan Note (Signed)
Controlled, continue as is.  Labs d/w pt.  Minimal inc in sugar.  He'll work on diet and exercise.

## 2015-04-23 NOTE — Assessment & Plan Note (Signed)
Diet encouraged. Discussed, work is the main impediment, and he is working on that.  Tetanus 2009 Flu shot done ~01/2015 Shingles shot prev done Colonoscopy 2010  Prostate cancer screening and PSA options (with potential risks and benefits of testing vs not testing) were discussed along with recent recs/guidelines.  He declined testing PSA at this point. Living will prev done. Wife designated if incapacitated. Pt opts in for HCV screening.  D/w pt re: routine screening.   We can get with next set of labs.  Order placed.   HIV declined, d/w pt.

## 2015-04-23 NOTE — Assessment & Plan Note (Signed)
Some urinary urgency. It happens about 60% of the time. No burning with urination. 2.5 cups of coffee a day. It isn't bothersome enough to do anything about at this point, ie no med start.  He can try tapering caffeine to see if that helps.  If progressive, we can w/u.  He agrees.  PSAs prev wnl.

## 2015-04-23 NOTE — Assessment & Plan Note (Signed)
Resolved with PPI tx for LRP.  F/w pt.

## 2015-05-12 ENCOUNTER — Ambulatory Visit: Payer: Managed Care, Other (non HMO) | Admitting: Primary Care

## 2016-01-15 ENCOUNTER — Encounter: Payer: Self-pay | Admitting: Family Medicine

## 2016-04-22 ENCOUNTER — Encounter: Payer: Managed Care, Other (non HMO) | Admitting: Family Medicine

## 2016-04-29 ENCOUNTER — Other Ambulatory Visit: Payer: Self-pay | Admitting: Family Medicine

## 2016-04-29 DIAGNOSIS — Z119 Encounter for screening for infectious and parasitic diseases, unspecified: Secondary | ICD-10-CM

## 2016-04-29 DIAGNOSIS — I10 Essential (primary) hypertension: Secondary | ICD-10-CM

## 2016-05-06 ENCOUNTER — Other Ambulatory Visit (INDEPENDENT_AMBULATORY_CARE_PROVIDER_SITE_OTHER): Payer: Managed Care, Other (non HMO)

## 2016-05-06 ENCOUNTER — Encounter (INDEPENDENT_AMBULATORY_CARE_PROVIDER_SITE_OTHER): Payer: Self-pay

## 2016-05-06 DIAGNOSIS — I10 Essential (primary) hypertension: Secondary | ICD-10-CM

## 2016-05-06 DIAGNOSIS — Z119 Encounter for screening for infectious and parasitic diseases, unspecified: Secondary | ICD-10-CM

## 2016-05-06 LAB — COMPREHENSIVE METABOLIC PANEL
ALT: 28 U/L (ref 0–53)
AST: 20 U/L (ref 0–37)
Albumin: 4.1 g/dL (ref 3.5–5.2)
Alkaline Phosphatase: 59 U/L (ref 39–117)
BUN: 20 mg/dL (ref 6–23)
CALCIUM: 9.1 mg/dL (ref 8.4–10.5)
CO2: 31 meq/L (ref 19–32)
Chloride: 105 mEq/L (ref 96–112)
Creatinine, Ser: 1.2 mg/dL (ref 0.40–1.50)
GFR: 64.66 mL/min (ref >60.00)
Glucose, Bld: 102 mg/dL — ABNORMAL HIGH (ref 70–99)
Potassium: 4.2 mEq/L (ref 3.5–5.1)
Sodium: 140 mEq/L (ref 135–145)
Total Bilirubin: 0.7 mg/dL (ref 0.2–1.2)
Total Protein: 6.6 g/dL (ref 6.0–8.3)

## 2016-05-06 LAB — LIPID PANEL
Cholesterol: 204 mg/dL — ABNORMAL HIGH (ref 0–200)
HDL: 51.6 mg/dL (ref >39.00)
LDL Cholesterol: 135 mg/dL — ABNORMAL HIGH (ref 0–99)
NonHDL: 152.27
TRIGLYCERIDES: 88 mg/dL (ref 0.0–149.0)
Total CHOL/HDL Ratio: 4
VLDL: 17.6 mg/dL (ref 0.0–40.0)

## 2016-05-07 LAB — HEPATITIS C ANTIBODY: HCV Ab: NEGATIVE

## 2016-05-10 ENCOUNTER — Encounter: Payer: Self-pay | Admitting: Family Medicine

## 2016-05-10 ENCOUNTER — Ambulatory Visit (INDEPENDENT_AMBULATORY_CARE_PROVIDER_SITE_OTHER): Payer: Managed Care, Other (non HMO) | Admitting: Family Medicine

## 2016-05-10 VITALS — BP 124/80 | HR 74 | Temp 98.1°F | Ht 70.5 in | Wt 217.2 lb

## 2016-05-10 DIAGNOSIS — I10 Essential (primary) hypertension: Secondary | ICD-10-CM

## 2016-05-10 DIAGNOSIS — Z125 Encounter for screening for malignant neoplasm of prostate: Secondary | ICD-10-CM

## 2016-05-10 DIAGNOSIS — R059 Cough, unspecified: Secondary | ICD-10-CM

## 2016-05-10 DIAGNOSIS — R399 Unspecified symptoms and signs involving the genitourinary system: Secondary | ICD-10-CM

## 2016-05-10 DIAGNOSIS — Z0000 Encounter for general adult medical examination without abnormal findings: Secondary | ICD-10-CM

## 2016-05-10 DIAGNOSIS — Z23 Encounter for immunization: Secondary | ICD-10-CM

## 2016-05-10 DIAGNOSIS — R05 Cough: Secondary | ICD-10-CM

## 2016-05-10 LAB — URINALYSIS, ROUTINE W REFLEX MICROSCOPIC
Bilirubin Urine: NEGATIVE
Hgb urine dipstick: NEGATIVE
KETONES UR: NEGATIVE
Leukocytes, UA: NEGATIVE
Nitrite: NEGATIVE
RBC / HPF: NONE SEEN (ref 0–?)
Specific Gravity, Urine: 1.015 (ref 1.000–1.030)
TOTAL PROTEIN, URINE-UPE24: NEGATIVE
UROBILINOGEN UA: 0.2 (ref 0.0–1.0)
Urine Glucose: NEGATIVE
WBC, UA: NONE SEEN (ref 0–?)
pH: 6 (ref 5.0–8.0)

## 2016-05-10 LAB — PSA: PSA: 2.82 ng/mL (ref 0.10–4.00)

## 2016-05-10 MED ORDER — LOSARTAN POTASSIUM-HCTZ 50-12.5 MG PO TABS: 0.5000 | ORAL_TABLET | Freq: Every day | ORAL | 3 refills | Status: DC

## 2016-05-10 MED ORDER — TAMSULOSIN HCL 0.4 MG PO CAPS: 0.4000 mg | ORAL_CAPSULE | Freq: Every day | ORAL | 3 refills | Status: DC

## 2016-05-10 NOTE — Assessment & Plan Note (Signed)
Clearly improved off ACE. Ctab, likely with some silent gerd vs lpr, d/w pt about chewing well and not eating quickly.  He'll update me as needed.

## 2016-05-10 NOTE — Addendum Note (Signed)
Addended by: Lurlean Nanny on: 05/10/2016 05:31 PM   Modules accepted: Orders

## 2016-05-10 NOTE — Patient Instructions (Signed)
Go to the lab on the way out.  We'll contact you with your lab report. Likely reasonable to start flomax after we get your labs back.  Take care.  Glad to see you.  Update me as needed.

## 2016-05-10 NOTE — Addendum Note (Signed)
Addended by: Ellamae Sia on: 05/10/2016 10:41 AM   Modules accepted: Orders

## 2016-05-10 NOTE — Progress Notes (Signed)
CPE- See plan.  Routine anticipatory guidance given to patient.  See health maintenance. Tetanus 2009 Flu shot done today at [facility]. PNA shot not due.  .  Shingles shot prev done Colonoscopy 2010  Prostate cancer screening d/w pt.  He has urinary urgency with back to back to back voiding.  Stream is still good.  Father with BPH requiring self cath after multiple interventions.   PSA screening d/w pt, ie pros and cons d/w pt.   Living will prev done. Wife designated if incapacitated. HCV screening done, neg.    Hypertension:    Using medication without problems or lightheadedness: yes Chest pain with exertion:no Edema:no Short of breath:no Lipids reasonable, d/w pt.  Mild hyperglycemia d/w pt.    Still with some cough but getting off ACE helped.  He seems to get it after eating or with a cold drink.  It may be silent reflux.  No heartburn.  His voice is normal now.  He eats quickly and that may contribute.  D/w pt.  No blood in stool, no vomiting.  Larger food bolus doesn't get stuck.    PMH and SH reviewed  Meds, vitals, and allergies reviewed.   ROS: Per HPI.  Unless specifically indicated otherwise in HPI, the patient denies:  General: fever. Eyes: acute vision changes ENT: sore throat Cardiovascular: chest pain Respiratory: SOB GI: vomiting GU: dysuria Musculoskeletal: acute back pain Derm: acute rash Neuro: acute motor dysfunction Psych: worsening mood Endocrine: polydipsia Heme: bleeding Allergy: hayfever  GEN: nad, alert and oriented HEENT: mucous membranes moist NECK: supple w/o LA CV: rrr. PULM: ctab, no inc wob ABD: soft, +bs EXT: no edema SKIN: no acute rash Prostate gland firm and smooth, no enlargement, nodularity, tenderness, mass, asymmetry or induration.

## 2016-05-10 NOTE — Assessment & Plan Note (Signed)
DRE wnl, check psa and u/a today.  D/w pt.  Okay to start flomax assuming labs wnl.  rx sent.  See notes on labs.  Likely with LUTS causing some incomplete emptying.  No FH prostate CA.

## 2016-05-10 NOTE — Assessment & Plan Note (Signed)
Controlled, continue work on diet and exercise.  Lipids reasonable, mild inc in sugar.  All d/w pt.

## 2016-05-10 NOTE — Assessment & Plan Note (Signed)
Tetanus 2009 Flu shot done today at [facility]. PNA shot not due.  .  Shingles shot prev done Colonoscopy 2010  Prostate cancer screening d/w pt.  He has urinary urgency with back to back to back voiding.  Stream is still good.  Father with BPH requiring self cath after multiple interventions.   PSA screening d/w pt, ie pros and cons d/w pt.   Living will prev done. Wife designated if incapacitated. HCV screening done, neg.

## 2016-12-31 ENCOUNTER — Ambulatory Visit (INDEPENDENT_AMBULATORY_CARE_PROVIDER_SITE_OTHER): Payer: Medicare Other | Admitting: Family Medicine

## 2016-12-31 ENCOUNTER — Encounter: Payer: Self-pay | Admitting: Family Medicine

## 2016-12-31 DIAGNOSIS — R05 Cough: Secondary | ICD-10-CM

## 2016-12-31 DIAGNOSIS — R059 Cough, unspecified: Secondary | ICD-10-CM

## 2016-12-31 MED ORDER — BENZONATATE 200 MG PO CAPS: 200.0000 mg | ORAL_CAPSULE | Freq: Three times a day (TID) | ORAL | 1 refills | Status: DC | PRN

## 2016-12-31 MED ORDER — LORATADINE 10 MG PO TABS: 10.0000 mg | ORAL_TABLET | Freq: Every day | ORAL | Status: DC

## 2016-12-31 NOTE — Progress Notes (Signed)
He had cough in the past that had resolved with tessalon.  This was about 2 years ago.  He was back to baseline w/o cough thereafter.    Cough.  Started about 2-3 weeks ago.  Dry cough.  No sputum usually.  Sometimes will get a little better with taking claritin or similar.  He has some cough when laying down, then falls asleep.  Worse if eating something cold.  No vomiting, no diarrhea.  Doesn't feel unwell or sick.  Not stuffy, no rhinorrhea.  He did not have episodes like this before he moved to [place] several years ago.  Meds, vitals, and allergies reviewed.   ROS: Per HPI unless specifically indicated in ROS section   GEN: nad, alert and oriented, well appearing HEENT: mucous membranes moist, TM wnl, nasal and OP exam unremarkable.   NECK: supple w/o LA CV: rrr.   PULM: ctab, no inc wob ABD: soft, +bs EXT: no edema SKIN: no acute rash

## 2016-12-31 NOTE — Patient Instructions (Signed)
Add tessalon to the claritin and update me if not better.  Take care.  Glad to see you.

## 2017-01-01 NOTE — Assessment & Plan Note (Signed)
He has only had episodes like this since moving to [place]. The cough feels like it originates in the neck or larynx. It does not feel like he has chest congestion. I question if he has an environmental allergy causing the cough, with subsequent irritation propagating the cough. Still well-appearing. Add Tessalon to loratadine. Update me if not better. He agrees.

## 2017-01-13 ENCOUNTER — Telehealth: Payer: Self-pay | Admitting: Family Medicine

## 2017-01-13 NOTE — Telephone Encounter (Signed)
Patient is covered by his insurance for Cologuard this year.  Patient wants to know if he has to wait for a physical or if he could do the Cologuard test before the end of the year.

## 2017-01-14 NOTE — Telephone Encounter (Signed)
Please proceed now and get cologuard send to patient.  Thanks.

## 2017-01-15 NOTE — Telephone Encounter (Signed)
Form faxed as instructed.

## 2017-01-15 NOTE — Telephone Encounter (Signed)
Form signed, thanks. On your desk.

## 2017-01-15 NOTE — Telephone Encounter (Signed)
Form is on your desk to sign.

## 2017-02-10 DIAGNOSIS — Z23 Encounter for immunization: Secondary | ICD-10-CM | POA: Diagnosis not present

## 2017-03-11 DIAGNOSIS — Z1212 Encounter for screening for malignant neoplasm of rectum: Secondary | ICD-10-CM | POA: Diagnosis not present

## 2017-03-11 DIAGNOSIS — Z1211 Encounter for screening for malignant neoplasm of colon: Secondary | ICD-10-CM | POA: Diagnosis not present

## 2017-03-11 LAB — COLOGUARD: Cologuard: NEGATIVE

## 2017-03-22 ENCOUNTER — Other Ambulatory Visit: Payer: Self-pay | Admitting: Family Medicine

## 2017-03-24 ENCOUNTER — Encounter: Payer: Self-pay | Admitting: Family Medicine

## 2017-03-24 NOTE — Telephone Encounter (Signed)
Electronic refill Last office visit 12/31/16/acute Last refill 05/10/16 #45/3 See allergy/contraindication

## 2017-03-25 NOTE — Telephone Encounter (Signed)
Sent. Schedule initial medicare wellness visit with me.  Thanks .

## 2017-03-26 NOTE — Telephone Encounter (Signed)
APPOINTMENT 3/12 OFFERED SOONER APPOINTMENT PT OUT OF TOWN TILL 1ST OF MARCH PT AWARE

## 2017-05-27 ENCOUNTER — Other Ambulatory Visit: Payer: Self-pay | Admitting: Family Medicine

## 2017-05-27 DIAGNOSIS — I10 Essential (primary) hypertension: Secondary | ICD-10-CM

## 2017-05-27 DIAGNOSIS — Z125 Encounter for screening for malignant neoplasm of prostate: Secondary | ICD-10-CM

## 2017-05-29 ENCOUNTER — Other Ambulatory Visit (INDEPENDENT_AMBULATORY_CARE_PROVIDER_SITE_OTHER): Payer: Medicare HMO

## 2017-05-29 DIAGNOSIS — I10 Essential (primary) hypertension: Secondary | ICD-10-CM

## 2017-05-29 DIAGNOSIS — Z125 Encounter for screening for malignant neoplasm of prostate: Secondary | ICD-10-CM

## 2017-05-29 LAB — LIPID PANEL
Cholesterol: 216 mg/dL — ABNORMAL HIGH (ref 0–200)
HDL: 49.7 mg/dL (ref >39.00)
LDL Cholesterol: 156 mg/dL — ABNORMAL HIGH (ref 0–99)
NonHDL: 166.76
TRIGLYCERIDES: 54 mg/dL (ref 0.0–149.0)
Total CHOL/HDL Ratio: 4
VLDL: 10.8 mg/dL (ref 0.0–40.0)

## 2017-05-29 LAB — COMPREHENSIVE METABOLIC PANEL
ALK PHOS: 62 U/L (ref 39–117)
ALT: 24 U/L (ref 0–53)
AST: 16 U/L (ref 0–37)
Albumin: 4.2 g/dL (ref 3.5–5.2)
BILIRUBIN TOTAL: 0.7 mg/dL (ref 0.2–1.2)
BUN: 21 mg/dL (ref 6–23)
CALCIUM: 9.7 mg/dL (ref 8.4–10.5)
CO2: 30 mEq/L (ref 19–32)
Chloride: 103 mEq/L (ref 96–112)
Creatinine, Ser: 1.19 mg/dL (ref 0.40–1.50)
GFR: 65.08 mL/min (ref >60.00)
Glucose, Bld: 93 mg/dL (ref 70–99)
POTASSIUM: 4.5 meq/L (ref 3.5–5.1)
Sodium: 139 mEq/L (ref 135–145)
TOTAL PROTEIN: 6.8 g/dL (ref 6.0–8.3)

## 2017-05-29 LAB — PSA, MEDICARE: PSA: 4.11 ng/ml — ABNORMAL HIGH (ref 0.10–4.00)

## 2017-06-03 ENCOUNTER — Ambulatory Visit (INDEPENDENT_AMBULATORY_CARE_PROVIDER_SITE_OTHER): Payer: Medicare HMO | Admitting: Family Medicine

## 2017-06-03 ENCOUNTER — Encounter: Payer: Self-pay | Admitting: Family Medicine

## 2017-06-03 VITALS — BP 102/60 | HR 64 | Temp 97.8°F | Ht 71.0 in | Wt 217.5 lb

## 2017-06-03 DIAGNOSIS — Z0000 Encounter for general adult medical examination without abnormal findings: Secondary | ICD-10-CM | POA: Diagnosis not present

## 2017-06-03 DIAGNOSIS — Z23 Encounter for immunization: Secondary | ICD-10-CM

## 2017-06-03 DIAGNOSIS — I10 Essential (primary) hypertension: Secondary | ICD-10-CM

## 2017-06-03 DIAGNOSIS — Z029 Encounter for administrative examinations, unspecified: Secondary | ICD-10-CM

## 2017-06-03 DIAGNOSIS — R399 Unspecified symptoms and signs involving the genitourinary system: Secondary | ICD-10-CM | POA: Diagnosis not present

## 2017-06-03 DIAGNOSIS — Z7189 Other specified counseling: Secondary | ICD-10-CM

## 2017-06-03 DIAGNOSIS — Z136 Encounter for screening for cardiovascular disorders: Secondary | ICD-10-CM

## 2017-06-03 DIAGNOSIS — R972 Elevated prostate specific antigen [PSA]: Secondary | ICD-10-CM

## 2017-06-03 MED ORDER — LOSARTAN POTASSIUM-HCTZ 50-12.5 MG PO TABS: 0.5000 | ORAL_TABLET | Freq: Every day | ORAL | 3 refills | Status: DC

## 2017-06-03 MED ORDER — TAMSULOSIN HCL 0.4 MG PO CAPS: 0.4000 mg | ORAL_CAPSULE | Freq: Every day | ORAL | 3 refills | Status: DC

## 2017-06-03 NOTE — Progress Notes (Signed)
I have personally reviewed the Medicare Annual Wellness questionnaire and have noted 1. The patient's medical and social history 2. Their use of alcohol, tobacco or illicit drugs 3. Their current medications and supplements 4. The patient's functional ability including ADL's, fall risks, home safety risks and hearing or visual             impairment. 5. Diet and physical activities 6. Evidence for depression or mood disorders  The patients weight, height, BMI have been recorded in the chart and visual acuity is per eye clinic.  I have made referrals, counseling and provided education to the patient based review of the above and I have provided the pt with a written personalized care plan for preventive services.  Provider list updated- see scanned forms.  Routine anticipatory guidance given to patient.  See health maintenance. The possibility exists that previously documented standard health maintenance information may have been brought forward from a previous encounter into this note.  If needed, that same information has been updated to reflect the current situation based on today's encounter.    Tetanus d/w pt.  See AVS.  Flu shot done 03/2017.   PNA shot not due. Shingles shot prev done Colonoscopy 2010  Prostate cancer screening d/w pt.  see below.  Living will prev done. Wife designated if patient were incapacitated. HCV screening done, neg.   AAA screen d/w pt.  See AVS.   PSA elevation.  He has urinary urgency and frequency.  He is already on flomax but still having LUTS.  Unclear how much it helped prev.  No FH prostate cancer.  No blood in urine.  No pain with urination.  Stream is still normal except at night with nocturia 1-2x/night.    Hypertension:    Using medication without problems or lightheadedness: yes Chest pain with exertion:no Edema:no Short of breath:no He has been on silver sneakers and working out.  Intentional weight loss.    PMH and SH reviewed  Meds,  vitals, and allergies reviewed.   ROS: Per HPI.  Unless specifically indicated otherwise in HPI, the patient denies:  General: fever. Eyes: acute vision changes ENT: sore throat Cardiovascular: chest pain Respiratory: SOB GI: vomiting GU: dysuria Musculoskeletal: acute back pain Derm: acute rash Neuro: acute motor dysfunction Psych: worsening mood Endocrine: polydipsia Heme: bleeding Allergy: hayfever  GEN: nad, alert and oriented HEENT: mucous membranes moist NECK: supple w/o LA CV: rrr. PULM: ctab, no inc wob ABD: soft, +bs EXT: no edema SKIN: no acute rash

## 2017-06-03 NOTE — Patient Instructions (Addendum)
Check with your insurance to see if they will cover the tetanus (Tdap) shot. PNA 13 today.  Follow PNA 23 later on.  Dalton Delacruz will call about your referral for the AAA ultrasound testing and urology.   If you get lightheaded then stop the losartan and update me.   Take care.  Glad to see you.  Thanks for your effort (diet/exercise/etc).

## 2017-06-05 DIAGNOSIS — L821 Other seborrheic keratosis: Secondary | ICD-10-CM | POA: Diagnosis not present

## 2017-06-05 DIAGNOSIS — L814 Other melanin hyperpigmentation: Secondary | ICD-10-CM | POA: Diagnosis not present

## 2017-06-05 DIAGNOSIS — D225 Melanocytic nevi of trunk: Secondary | ICD-10-CM | POA: Diagnosis not present

## 2017-06-05 DIAGNOSIS — D1801 Hemangioma of skin and subcutaneous tissue: Secondary | ICD-10-CM | POA: Diagnosis not present

## 2017-06-05 NOTE — Assessment & Plan Note (Signed)
PSA elevation.  He has urinary urgency and frequency.  He is already on flomax but still having LUTS.  Unclear how much it helped prev.  No FH prostate cancer.  No blood in urine.  No pain with urination.  Stream is still normal except at night with nocturia 1-2x/night.  PSA elevation discussed with patient.  I deferred urinalysis and rectal exam.  Even if his rectal exam and/or urinalysis were completely normal, I would still have him see urology.  We talked about issues with false positives with PSAs.  He understood.  I would appreciate urology input.

## 2017-06-05 NOTE — Assessment & Plan Note (Signed)
Tetanus d/w pt.  See AVS.  Flu shot done 03/2017.   PNA shot not due. Shingles shot prev done Colonoscopy 2010  Prostate cancer screening d/w pt.  see below.  Living will prev done. Wife designated if patient were incapacitated. HCV screening done, neg.   AAA screen d/w pt.  See AVS.

## 2017-06-05 NOTE — Assessment & Plan Note (Signed)
Controlled, he may end up being overtreated if he continues to work on diet and exercise.  If he is getting lightheaded then stop medication and update me.  See after visit summary.  Labs discussed with patient.  He agrees. Diet and exercise d/w pt.

## 2017-06-05 NOTE — Assessment & Plan Note (Signed)
Living will prev done. Wife designated if patient were incapacitated. 

## 2017-06-12 ENCOUNTER — Encounter: Payer: Self-pay | Admitting: Urology

## 2017-06-12 ENCOUNTER — Ambulatory Visit: Payer: Medicare HMO | Admitting: Urology

## 2017-06-12 VITALS — BP 127/84 | HR 67 | Ht 71.0 in | Wt 219.0 lb

## 2017-06-12 DIAGNOSIS — N3281 Overactive bladder: Secondary | ICD-10-CM

## 2017-06-12 DIAGNOSIS — R972 Elevated prostate specific antigen [PSA]: Secondary | ICD-10-CM | POA: Diagnosis not present

## 2017-06-12 DIAGNOSIS — N400 Enlarged prostate without lower urinary tract symptoms: Secondary | ICD-10-CM

## 2017-06-12 LAB — BLADDER SCAN AMB NON-IMAGING: Scan Result: 95

## 2017-06-12 MED ORDER — MIRABEGRON ER 25 MG PO TB24: 25.0000 mg | ORAL_TABLET | Freq: Every day | ORAL | 11 refills | Status: DC

## 2017-06-12 NOTE — Progress Notes (Signed)
06/12/2017 9:09 AM   Dolores Frame 1951-01-28 [id number]  Referring provider: Tonia Ghent, MD [address] [place], [place] [address]  Chief Complaint  Patient presents with  . Elevated PSA    HPI: The patient is a 67 year old gentleman who presents today to discuss his elevated PSA of 4.11.  1.  Elevated PSA Patient with PSA of 4.11 in March 2019.  This is up from 2.82 in February 2018.  The patient has no personal or family history of prostate cancer.  He has never had an elevated PSA before.  His PSA has tended to run in the 2 range.  2.  BPH Patient is currently on Flomax for lower urinary tract symptoms.  His major complaint is urinary urgency with mild urge incontinence.  This happens a couple times a day particularly after drinking fluids.  He has nocturia x1.  He has a good stream and feels he otherwise empties his bladder.  His PVR today is 95.  Is increasingly bothered by the urgency as it interrupts his daily life such as playing golf.   PMH: Past Medical History:  Diagnosis Date  . Displaced spiral fracture of shaft of left humerus ~ 2002  . GERD (gastroesophageal reflux disease)    with LPR  . Hypertension     Surgical History: Past Surgical History:  Procedure Laterality Date  . CATARACT EXTRACTION Bilateral    2015  . COLONOSCOPY     2010  . FISTULOTOMY  03/04/13  . VASECTOMY      Home Medications:  Allergies as of 06/12/2017      Reactions   Lisinopril    cough      Medication List        Accurate as of 06/12/17  9:09 AM. Always use your most recent med list.          aspirin EC 81 MG tablet Take 1 tablet (81 mg total) by mouth daily.   COMPLETE PROSTATE HEALTH PO Take 400 mg by mouth daily.   Fish Oil 500 MG Caps Take by mouth daily.   loratadine 10 MG tablet Commonly known as:  CLARITIN Take 1 tablet (10 mg total) by mouth daily.   losartan-hydrochlorothiazide 50-12.5 MG tablet Commonly known as:  HYZAAR Take 0.5  tablets by mouth daily.   tamsulosin 0.4 MG Caps capsule Commonly known as:  FLOMAX Take 1 capsule (0.4 mg total) by mouth daily.       Allergies:  Allergies  Allergen Reactions  . Lisinopril     cough    Family History: Family History  Problem Relation Age of Onset  . Cancer Mother        Breast  . Diabetes Mother   . Hyperlipidemia Mother   . Hypertension Mother   . Heart disease Mother   . Ulcers Father        stomach ulcers  . Heart disease Father   . Diabetes Brother   . Prostate cancer Neg Hx   . Colon cancer Neg Hx     Social History:  reports that he quit smoking about 7 years ago. His smoking use included cigars. He has a 5.00 pack-year smoking history. He has never used smokeless tobacco. He reports that he drinks alcohol. He reports that he does not use drugs.  ROS: UROLOGY Frequent Urination?: Yes Hard to postpone urination?: Yes Burning/pain with urination?: No Get up at night to urinate?: Yes Leakage of urine?: No Urine stream  starts and stops?: No Trouble starting stream?: No Do you have to strain to urinate?: No Blood in urine?: No Urinary tract infection?: No Sexually transmitted disease?: No Injury to kidneys or bladder?: No Painful intercourse?: No Weak stream?: No Erection problems?: No Penile pain?: No  Gastrointestinal Nausea?: No Vomiting?: No Indigestion/heartburn?: No Diarrhea?: No Constipation?: No  Constitutional Fever: No Night sweats?: No Weight loss?: No Fatigue?: No  Skin Skin rash/lesions?: No Itching?: No  Eyes Blurred vision?: No Double vision?: No  Ears/Nose/Throat Sore throat?: No Sinus problems?: No  Hematologic/Lymphatic Swollen glands?: No Easy bruising?: No  Cardiovascular Leg swelling?: No Chest pain?: No  Respiratory Cough?: No Shortness of breath?: No  Endocrine Excessive thirst?: No  Musculoskeletal Back pain?: No Joint pain?: No  Neurological Headaches?: No Dizziness?:  No  Psychologic Depression?: No Anxiety?: No  Physical Exam: BP 127/84 (BP Location: Right Arm, Patient Position: Sitting, Cuff Size: Large)   Pulse 67   Ht 5\' 11"  (1.803 m)   Wt 219 lb (99.3 kg)   BMI 30.54 kg/m   Constitutional:  Alert and oriented, No acute distress. HEENT:  AT, moist mucus membranes.  Trachea midline, no masses. Cardiovascular: No clubbing, cyanosis, or edema. Respiratory: Normal respiratory effort, no increased work of breathing. GI: Abdomen is soft, nontender, nondistended, no abdominal masses GU: No CVA tenderness.  Normal phallus.  Testicles descended bilaterally.  Benign.  DRE: 2+ smooth benign. Skin: No rashes, bruises or suspicious lesions. Lymph: No cervical or inguinal adenopathy. Neurologic: Grossly intact, no focal deficits, moving all 4 extremities. Psychiatric: Normal mood and affect.  Laboratory Data: Lab Results  Component Value Date   WBC 8.7 07/19/2014   HGB 14.7 07/19/2014   HCT 43.7 07/19/2014   MCV 94 07/19/2014   PLT 263 07/19/2014    Lab Results  Component Value Date   CREATININE 1.19 05/29/2017    Lab Results  Component Value Date   PSA 4.11 (H) 05/29/2017   PSA 2.82 05/10/2016   PSA 2.37 02/03/2012    Lab Results  Component Value Date   TESTOSTERONE 779.65 08/08/2014    No results found for: HGBA1C  Urinalysis    Component Value Date/Time   COLORURINE YELLOW 05/10/2016 1040   APPEARANCEUR CLEAR 05/10/2016 1040   APPEARANCEUR Clear 08/02/2012 1318   LABSPEC 1.015 05/10/2016 1040   LABSPEC 1.016 08/02/2012 1318   PHURINE 6.0 05/10/2016 1040   GLUCOSEU NEGATIVE 05/10/2016 1040   HGBUR NEGATIVE 05/10/2016 1040   BILIRUBINUR NEGATIVE 05/10/2016 1040   BILIRUBINUR Negative 08/02/2012 1318   KETONESUR NEGATIVE 05/10/2016 1040   PROTEINUR Negative 08/02/2012 1318   UROBILINOGEN 0.2 05/10/2016 1040   NITRITE NEGATIVE 05/10/2016 1040   LEUKOCYTESUR NEGATIVE 05/10/2016 1040   LEUKOCYTESUR Negative 08/02/2012  1318    Assessment & Plan:    1.  Elevated PSA Discussed the patient the role of PSA screening for prostate cancer.  We did discuss that the first that would be to repeat his PSA to confirm true elevation.  We did discuss that pending this we will tentatively schedule him for a prostate biopsy.  We discussed the risk, benefits, indications of a prostate biopsy.  He understands the risks include but are not limited to bleeding and infection.  He understands the blood in his urine and stool for 48 hours in his semen for up to 6 weeks.  He also understands the risk of infection requiring hospitalization for IV antibiotics despite periprocedural antibiotics.  All questions were answered.  The patient  is elected to proceed pending repeat PSA today.  2.  Overactive bladder The patient's symptoms are consistent more with an overactive bladder.  We will try him on Myrbetriq 25 mg daily.  If this is cost prohibitive, we can try an anticholinergic.  3.  BPH Obstructive urinary symptoms well controlled.  Continue Flomax for now.  Return for prostate biopsy in a few weeks.  Nickie Retort, MD  [facility] [address], [place], [place] [address] [phone]

## 2017-06-13 ENCOUNTER — Encounter: Payer: Self-pay | Admitting: Family Medicine

## 2017-06-13 ENCOUNTER — Telehealth: Payer: Self-pay

## 2017-06-13 DIAGNOSIS — R972 Elevated prostate specific antigen [PSA]: Secondary | ICD-10-CM

## 2017-06-13 LAB — PSA: Prostate Specific Ag, Serum: 3.3 ng/mL (ref 0.0–4.0)

## 2017-06-13 NOTE — Telephone Encounter (Signed)
Nickie Retort, MD  Toniann Fail C, LPN        Please let patient know PSA fell back to normal range near his previous level. We can cancel biopsy. He should follow up in 6 months with PSA prior. Thanks.    Spoke with pt in reference to PSA results and cancelling prostate bx. Pt voiced understanding. 71mo f/u appts made and orders placed. Bx and results appts cancelled.

## 2017-06-16 DIAGNOSIS — R69 Illness, unspecified: Secondary | ICD-10-CM | POA: Diagnosis not present

## 2017-06-26 ENCOUNTER — Other Ambulatory Visit: Payer: Medicare HMO

## 2017-06-30 ENCOUNTER — Encounter: Payer: Self-pay | Admitting: Family Medicine

## 2017-07-02 ENCOUNTER — Ambulatory Visit (INDEPENDENT_AMBULATORY_CARE_PROVIDER_SITE_OTHER): Payer: Medicare HMO

## 2017-07-02 DIAGNOSIS — Z136 Encounter for screening for cardiovascular disorders: Secondary | ICD-10-CM

## 2017-07-14 ENCOUNTER — Encounter: Payer: Self-pay | Admitting: Family Medicine

## 2017-07-16 ENCOUNTER — Telehealth: Payer: Self-pay

## 2017-07-16 NOTE — Telephone Encounter (Signed)
Pt insurance will not cover myrbetriq. Pt is requesting another medication.

## 2017-07-17 ENCOUNTER — Other Ambulatory Visit: Payer: Medicare HMO

## 2017-07-28 MED ORDER — SOLIFENACIN SUCCINATE 5 MG PO TABS: 5.0000 mg | ORAL_TABLET | Freq: Every day | ORAL | 11 refills | Status: DC

## 2017-07-31 ENCOUNTER — Ambulatory Visit: Payer: Medicare HMO

## 2017-08-01 ENCOUNTER — Telehealth: Payer: Self-pay | Admitting: Urology

## 2017-08-01 NOTE — Telephone Encounter (Signed)
Okay for [place]?

## 2017-08-01 NOTE — Telephone Encounter (Signed)
Pt had rx for Mybetriq, too expensive, fomulary Rx that was sent in is not covered  pharmacy called to notify that [place] 4mg  is covered if you'd like to send Rx in for [place]. Please call [phone] to advise. Thanks.

## 2017-08-04 ENCOUNTER — Telehealth: Payer: Self-pay | Admitting: Urology

## 2017-08-04 NOTE — Telephone Encounter (Signed)
Patient's insurance will not cover the vesicare but will cover Toviaz. Can we change the script? He uses [facility] in [place].   Thanks,  Sharyn Lull

## 2017-08-05 MED ORDER — FESOTERODINE FUMARATE ER 4 MG PO TB24: 4.0000 mg | ORAL_TABLET | Freq: Every day | ORAL | 11 refills | Status: DC

## 2017-08-05 NOTE — Addendum Note (Signed)
Addended by: Garnette Gunner on: 08/05/2017 04:50 PM   Modules accepted: Orders

## 2017-09-30 ENCOUNTER — Encounter: Payer: Self-pay | Admitting: *Deleted

## 2017-10-01 ENCOUNTER — Ambulatory Visit (INDEPENDENT_AMBULATORY_CARE_PROVIDER_SITE_OTHER): Payer: Medicare HMO | Admitting: Family Medicine

## 2017-10-01 ENCOUNTER — Other Ambulatory Visit: Payer: Self-pay

## 2017-10-01 ENCOUNTER — Encounter: Payer: Self-pay | Admitting: Family Medicine

## 2017-10-01 ENCOUNTER — Ambulatory Visit (INDEPENDENT_AMBULATORY_CARE_PROVIDER_SITE_OTHER)
Admission: RE | Admit: 2017-10-01 | Discharge: 2017-10-01 | Disposition: A | Discharge status: Home / Self Care | Payer: Medicare HMO | Source: Ambulatory Visit | Attending: Family Medicine | Admitting: Family Medicine

## 2017-10-01 VITALS — BP 142/80 | HR 61 | Temp 97.9°F | Ht 71.0 in | Wt 209.2 lb

## 2017-10-01 DIAGNOSIS — R05 Cough: Secondary | ICD-10-CM | POA: Diagnosis not present

## 2017-10-01 DIAGNOSIS — R059 Cough, unspecified: Secondary | ICD-10-CM

## 2017-10-01 DIAGNOSIS — Z87891 Personal history of nicotine dependence: Secondary | ICD-10-CM | POA: Diagnosis not present

## 2017-10-01 MED ORDER — AZITHROMYCIN 250 MG PO TABS: ORAL_TABLET | ORAL | 0 refills | Status: DC

## 2017-10-01 NOTE — Progress Notes (Signed)
Dr. Frederico Hamman T. Zamir Staples, MD, [facility] [place], [address] Phone: [phone] Fax: [phone]  10/01/2017  Patient: Dalton Delacruz, MRN: [id number], DOB: [birth year], 67 y.o.  Primary Physician:  Tonia Ghent, MD   Chief Complaint  Patient presents with  . Cough    Persistent-going on for 5 to 6 weeks   Subjective:   Garey Alleva is a 67 y.o. very pleasant male patient who presents with the following:  Take mucinex and and has been taking that.  5-6 weeks of ongoing cough. Has not gotten better at all.  Not to his knowledge.  No travelling.   No TB risk use.   Occ cigar.   Light yellow. No blood in sputum.   Past Medical History, Surgical History, Social History, Family History, Problem List, Medications, and Allergies have been reviewed and updated if relevant.  Patient Active Problem List   Diagnosis Date Noted  . Lower urinary tract symptoms (LUTS) 02/10/2014  . Hypertension 04/21/2011  . Syncope 03/10/2011  . Medicare welcome exam 06/26/2010  . Family history of diabetes mellitus (DM) 06/18/2010    Past Medical History:  Diagnosis Date  . Displaced spiral fracture of shaft of left humerus ~ 2002  . GERD (gastroesophageal reflux disease)    with LPR  . Hypertension     Past Surgical History:  Procedure Laterality Date  . CATARACT EXTRACTION Bilateral    2015  . COLONOSCOPY     2010  . FISTULOTOMY  03/04/13  . VASECTOMY      Social History   Socioeconomic History  . Marital status: Married    Spouse name: Not on file  . Number of children: 3  . Years of education: Not on file  . Highest education level: Not on file  Occupational History  . Occupation: VP     Comment: Engineer, water  Social Needs  . Financial resource strain: Not on file  . Food insecurity:    Worry: Not on file    Inability: Not on file  . Transportation needs:    Medical: Not on file   Non-medical: Not on file  Tobacco Use  . Smoking status: Former Smoker    Packs/day: 1.00    Years: 5.00    Pack years: 5.00    Types: Cigars    Last attempt to quit: 03/25/2010    Years since quitting: 7.5  . Smokeless tobacco: Never Used  Substance and Sexual Activity  . Alcohol use: Yes    Alcohol/week: 0.0 oz    Comment: A few drinks a week  . Drug use: No  . Sexual activity: Not on file  Lifestyle  . Physical activity:    Days per week: Not on file    Minutes per session: Not on file  . Stress: Not on file  Relationships  . Social connections:    Talks on phone: Not on file    Gets together: Not on file    Attends religious service: Not on file    Active member of club or organization: Not on file    Attends meetings of clubs or organizations: Not on file    Relationship status: Not on file  . Intimate partner violence:    Fear of current or ex partner: Not on file    Emotionally abused: Not on file    Physically abused: Not on file    Forced sexual activity: Not on file  Other Topics Concern  . 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     Married 1975, 3 kids, 5 grandkids    Family History  Problem Relation Age of Onset  . Cancer Mother        Breast  . Diabetes Mother   . Hyperlipidemia Mother   . Hypertension Mother   . Heart disease Mother   . Ulcers Father        stomach ulcers  . Heart disease Father   . Diabetes Brother   . Prostate cancer Neg Hx   . Colon cancer Neg Hx     Allergies  Allergen Reactions  . Lisinopril     cough    Medication list reviewed and updated in full in [facility].  ROS: GEN: Acute illness details above GI:  Tolerating PO intake GU: maintaining adequate hydration and urination Pulm: No SOB Interactive and getting along well at home.  Otherwise, ROS is as per the HPI.  Objective:   BP (!) 142/80   Pulse 61   Temp 97.9 F (36.6 C) (Oral)   Ht 5\' 11"  (1.803 m)   Wt 209 lb 4 oz (94.9 kg)   SpO2 95%   BMI 29.18 kg/m   GEN: A and O x 3. WDWN. NAD.    ENT: Nose clear, ext NML.  No LAD.  No JVD.  TM's clear. Oropharynx clear.  PULM: Normal WOB, no distress. No crackles, wheezes, rhonchi. CV: RRR, no M/G/R, No rubs, No JVD.   EXT: warm and well-perfused, No c/c/e. PSYCH: Pleasant and conversant.    Laboratory and Imaging Data: Dg Chest 2 View  Result Date: 10/01/2017 CLINICAL DATA:  Six weeks of cough, former smoker. History of hypertension. EXAM: CHEST - 2 VIEW COMPARISON:  PA and lateral chest x-ray of November 21, 2014 FINDINGS: The lungs are adequately inflated. There is no focal infiltrate. There is no pleural effusion. The heart and pulmonary vascularity are normal. The mediastinum is normal in width. The bony thorax exhibits no acute abnormality. IMPRESSION: There is no pneumonia nor other acute cardiopulmonary abnormality. Electronically Signed   By: David  Martinique M.D.   On: 10/01/2017 15:05    Assessment and Plan:   Cough - Plan: DG Chest 2 View  Former cigar smoker - Plan: DG Chest 2 View  Presumed atypical pna given length and will cover with zpak R middle lung with haziness on my read  Follow-up: No follow-ups on file.  Meds ordered this encounter  Medications  . azithromycin (ZITHROMAX) 250 MG tablet    Sig: 2 tabs po on day 1, then 1 tab po for 4 days    Dispense:  6 tablet    Refill:  0   Orders Placed This Encounter  Procedures  . DG Chest 2 View    Signed,  Frederico Hamman T. Arianni Gallego, MD   Allergies as of 10/01/2017      Reactions   Lisinopril    cough      Medication List        Accurate as of 10/01/17 11:59 PM. Always use your most recent med list.  aspirin EC 81 MG tablet Take 1 tablet (81 mg total) by mouth daily.   azithromycin 250 MG tablet Commonly known as:  ZITHROMAX 2 tabs po on day 1, then 1 tab po for 4 days   Fish Oil 500 MG Caps Take by mouth daily.   loratadine 10 MG tablet Commonly known as:  CLARITIN Take 1 tablet (10 mg total) by mouth daily.   tamsulosin 0.4 MG Caps capsule Commonly known as:  FLOMAX Take 1 capsule (0.4 mg total) by mouth daily.

## 2017-10-14 ENCOUNTER — Other Ambulatory Visit: Payer: Self-pay | Admitting: Family Medicine

## 2017-10-15 NOTE — Telephone Encounter (Signed)
Patient is calling back and states that he was taking this for a cough and he states the cough went away and now it has came back so he was going to try the prescription again. Please advise.

## 2017-10-15 NOTE — Telephone Encounter (Signed)
Left message on voicemail for patient to call back. Need to know why patient needs a refill on the antibiotic. Okay for PEC/triage nurse to talk with patient about this when he calls back.

## 2017-12-11 ENCOUNTER — Other Ambulatory Visit: Payer: Medicare HMO

## 2017-12-11 ENCOUNTER — Other Ambulatory Visit: Payer: Self-pay

## 2017-12-11 DIAGNOSIS — R972 Elevated prostate specific antigen [PSA]: Secondary | ICD-10-CM

## 2017-12-12 LAB — PSA: PROSTATE SPECIFIC AG, SERUM: 2.6 ng/mL (ref 0.0–4.0)

## 2017-12-16 ENCOUNTER — Ambulatory Visit: Payer: Medicare HMO | Admitting: Urology

## 2017-12-16 ENCOUNTER — Encounter: Payer: Self-pay | Admitting: Urology

## 2017-12-16 VITALS — BP 135/87 | HR 74 | Ht 71.0 in | Wt 210.0 lb

## 2017-12-16 DIAGNOSIS — R972 Elevated prostate specific antigen [PSA]: Secondary | ICD-10-CM

## 2017-12-16 DIAGNOSIS — R3915 Urgency of urination: Secondary | ICD-10-CM

## 2017-12-16 DIAGNOSIS — N3281 Overactive bladder: Secondary | ICD-10-CM | POA: Diagnosis not present

## 2017-12-16 LAB — BLADDER SCAN AMB NON-IMAGING

## 2017-12-16 MED ORDER — FESOTERODINE FUMARATE ER 8 MG PO TB24: 8.0000 mg | ORAL_TABLET | Freq: Every day | ORAL | 11 refills | Status: DC

## 2017-12-16 NOTE — Patient Instructions (Signed)
Saw Palmetto, [place] repens tablets and capsules What is this medicine? SAW PALMETTO (saw pal MET oh) is an herbal or dietary supplement. It is promoted to help support prostate health. The FDA has not approved this supplement for any medical use. This supplement may be used for other purposes; ask your health care provider or pharmacist if you have questions. This medicine may be used for other purposes; ask your health care provider or pharmacist if you have questions. What should I tell my health care provider before I take this medicine? They need to know if you have any of these conditions: -liver disease -prostate cancer -an unusual or allergic reaction to saw palmetto, other herbs, plants, medicines, foods, dyes, or preservatives -pregnant or trying to get pregnant -breast-feeding How should I use this medicine? Take this supplement by mouth with a glass of water. Follow the directions on the package labeling, or take as directed by your health care professional. If this supplement upsets your stomach, take it with food. Do not take this supplement more often than directed. Contact your pediatrician regarding the use of this supplement in children. Special care may be needed. Overdosage: If you think you have taken too much of this medicine contact a poison control center or emergency room at once. NOTE: This medicine is only for you. Do not share this medicine with others. What if I miss a dose? If you miss a dose, take it as soon as you can. If it is almost time for your next dose, take only that dose. Do not take double or extra doses. What may interact with this medicine? -other medicines for the prostate like finasteride, tamsulosin -hormones -warfarin This list may not describe all possible interactions. Give your health care provider a list of all the medicines, herbs, non-prescription drugs, or dietary supplements you use. Also tell them if you smoke, drink alcohol, or use  illegal drugs. Some items may interact with your medicine. What should I watch for while using this medicine? Tell your doctor or healthcare professional if your symptoms do not start to get better or if they get worse. If you are scheduled for any medical or dental procedure, tell your healthcare provider that you are taking this supplement. You may need to stop taking this supplement before the procedure. Herbal or dietary supplements are not regulated like medicines. Rigid quality control standards are not required for dietary supplements. The purity and strength of these products can vary. The safety and effect of this dietary supplement for a certain disease or illness is not well known. This product is not intended to diagnose, treat, cure or prevent any disease. The Food and Drug Administration suggests the following to help consumers protect themselves: -Always read product labels and follow directions. -Natural does not mean a product is safe for humans to take. -Look for products that include USP after the ingredient name. This means that the manufacturer followed the standards of the [place] Pharmacopoeia. -Supplements made or sold by a nationally known food or drug company are more likely to be made under tight controls. You can write to the company for more information about how the product was made. What side effects may I notice from receiving this medicine? Side effects that you should report to your doctor or health care professional as soon as possible: -allergic reactions like skin rash, itching or hives, swelling of the face, lips, or tongue -breathing problems -dark urine -fever -general ill feeling or flu-like symptoms -light-colored stools -loss  of appetite, nausea -right upper belly pain -trouble passing urine or change in the amount of urine -unusually weak or tired -yellowing of the eyes or skin Side effects that usually do not require medical attention (report to your  doctor or health care professional if they continue or are bothersome): -breast tenderness or enlargement -diarrhea -headache -stomach upset This list may not describe all possible side effects. Call your doctor for medical advice about side effects. You may report side effects to FDA at 1-800-FDA-1088. Where should I keep my medicine? Keep out of the reach of children. Store at room temperature between 15 and 30 degrees C (59 and 86 degrees F) or as directed on the package label. Protect from moisture. Throw away any unused supplement after the expiration date. NOTE: This sheet is a summary. It may not cover all possible information. If you have questions about this medicine, talk to your doctor, pharmacist, or health care provider.  2018 Elsevier/Gold Standard (2007-11-12 15:15:00)

## 2017-12-16 NOTE — Progress Notes (Signed)
12/16/2017 11:48 AM   Dalton Delacruz September 26, 1950 [id number]  Referring provider: Tonia Ghent, MD [address] [place], [place] [address]  Chief Complaint  Patient presents with  . Elevated PSA    6 month    HPI: 67 year old male previously followed by Dr. Pilar Jarvis for history of elevated PSA and BPH.  He initially presented with elevated PSA of 4.11 2019.  Since subsequently trended back down to his baseline values in the 2 range.  His most recent PSA was 2.6 on 11/2017.   Rectal exam 6 months ago showed 2+ enlargement, otherwise no nodules.  He is currently on Flomax for several years for BPH symptoms by his PCP.   At the time of evaluation, he was having some urinary urgency with occasional urge incontinence.  He was given a prescription of Myrbetriq by Dr. Pilar Jarvis trial.  He took a month of this medication, 25 mg and did not notice much difference.  When he were to fill the prescription, the cost was too much.  Ultimately, it looks like his insurance company covers Lisbeth Ply and there was some conversation about starting this medication but this was never pursued.  Today, he continues to have urinary urgency and nocturia x3 as his primary symptoms.    Is bothersome to him especially when traveling in the car and has to stop several times.  He stopped drinking beverages about 2 hours before bedtime.  He does have varicose veins.  He did have a sleep study which is negative for OSA.  He does avoid irritating beverages other than a cup of coffee in the morning.   IPSS as below.    IPSS    Row Name 12/16/17 1000         International Prostate Symptom Score   How often have you had the sensation of not emptying your bladder?  Less than half the time     How often have you had to urinate less than every two hours?  About half the time     How often have you found you stopped and started again several times when you urinated?  More than half the time     How often have you found  it difficult to postpone urination?  About half the time     How often have you had a weak urinary stream?  Less than 1 in 5 times     How often have you had to strain to start urination?  Not at All     How many times did you typically get up at night to urinate?  3 Times     Total IPSS Score  16       Quality of Life due to urinary symptoms   If you were to spend the rest of your life with your urinary condition just the way it is now how would you feel about that?  Mostly Disatisfied      PVR 29 cc  Score:  1-7 Mild 8-19 Moderate 20-35 Severe   PMH: Past Medical History:  Diagnosis Date  . Displaced spiral fracture of shaft of left humerus ~ 2002  . GERD (gastroesophageal reflux disease)    with LPR  . Hypertension     Surgical History: Past Surgical History:  Procedure Laterality Date  . CATARACT EXTRACTION Bilateral    2015  . COLONOSCOPY     2010  . FISTULOTOMY  03/04/13  . VASECTOMY      Home Medications:  Allergies as of 12/16/2017      Reactions   Lisinopril    cough      Medication List        Accurate as of 12/16/17 11:48 AM. Always use your most recent med list.          aspirin EC 81 MG tablet Take 1 tablet (81 mg total) by mouth daily.   fesoterodine 8 MG Tb24 tablet Commonly known as:  TOVIAZ Take 1 tablet (8 mg total) by mouth daily.   Fish Oil 500 MG Caps Take by mouth daily.   loratadine 10 MG tablet Commonly known as:  CLARITIN Take 1 tablet (10 mg total) by mouth daily.   tamsulosin 0.4 MG Caps capsule Commonly known as:  FLOMAX Take 1 capsule (0.4 mg total) by mouth daily.       Allergies:  Allergies  Allergen Reactions  . Lisinopril     cough    Family History: Family History  Problem Relation Age of Onset  . Cancer Mother        Breast  . Diabetes Mother   . Hyperlipidemia Mother   . Hypertension Mother   . Heart disease Mother   . Ulcers Father        stomach ulcers  . Heart disease Father   . Diabetes  Brother   . Prostate cancer Neg Hx   . Colon cancer Neg Hx     Social History:  reports that he quit smoking about 7 years ago. His smoking use included cigars. He has a 5.00 pack-year smoking history. He has never used smokeless tobacco. He reports that he drinks alcohol. He reports that he does not use drugs.  ROS: UROLOGY Frequent Urination?: Yes Hard to postpone urination?: Yes Burning/pain with urination?: No Get up at night to urinate?: Yes Leakage of urine?: No Urine stream starts and stops?: No Trouble starting stream?: No Do you have to strain to urinate?: No Blood in urine?: No Urinary tract infection?: No Sexually transmitted disease?: No Injury to kidneys or bladder?: No Painful intercourse?: No Weak stream?: No Erection problems?: No Penile pain?: No  Gastrointestinal Nausea?: No Vomiting?: No Indigestion/heartburn?: No Diarrhea?: No Constipation?: No  Constitutional Fever: No Night sweats?: No Weight loss?: No Fatigue?: No  Skin Skin rash/lesions?: No Itching?: No  Eyes Blurred vision?: No Double vision?: No  Ears/Nose/Throat Sore throat?: No Sinus problems?: No  Hematologic/Lymphatic Swollen glands?: No Easy bruising?: No  Cardiovascular Leg swelling?: No Chest pain?: No  Respiratory Cough?: No Shortness of breath?: No  Endocrine Excessive thirst?: No  Musculoskeletal Back pain?: No Joint pain?: No  Neurological Headaches?: No Dizziness?: No  Psychologic Depression?: No Anxiety?: No  Physical Exam: BP 135/87   Pulse 74   Ht 5\' 11"  (1.803 m)   Wt 210 lb (95.3 kg)   BMI 29.29 kg/m   Constitutional:  Alert and oriented, No acute distress. HEENT:  AT, moist mucus membranes.  Trachea midline, no masses. Cardiovascular: No clubbing, cyanosis, or edema.  Varicose veins noted.   Respiratory: Normal respiratory effort, no increased work of breathing. GI: Abdomen is soft, nontender, nondistended, no abdominal masses GU:  No CVA tenderness Skin: No rashes, bruises or suspicious lesions. Neurologic: Grossly intact, no focal deficits, moving all 4 extremities. Psychiatric: Normal mood and affect.  Laboratory Data: Lab Results  Component Value Date   WBC 8.7 07/19/2014   HGB 14.7 07/19/2014   HCT 43.7 07/19/2014   MCV 94 07/19/2014   PLT  263 07/19/2014    Lab Results  Component Value Date   CREATININE 1.19 05/29/2017   Component     Latest Ref Rng & Units 06/12/2017 12/11/2017  Prostate Specific Ag, Serum     0.0 - 4.0 ng/mL 3.3 2.6     Lab Results  Component Value Date   TESTOSTERONE 779.65 08/08/2014    Urinalysis N/a  Pertinent Imaging: Results for orders placed or performed in visit on 12/16/17  BLADDER SCAN AMB NON-IMAGING  Result Value Ref Range   Scan Result 41ml     Assessment & Plan:    1. Elevated PSA PSA back to baseline Recommended routine annual PSA screening at least until the age of 49, possibly longer if he remains healthy - BLADDER SCAN AMB NON-IMAGING  2. OAB (overactive bladder) Primarily irritative voiding symptoms with urinary urgency frequency and nocturia We discussed this often multifactorial We will pursue this in a stepwise fashion, given the minimal obstructive urinary symptoms, he will stop his Flomax for a little while and see if his urinary symptoms remain stable.  If they do and he continues to have irritative voiding symptoms, he will add Toviaz to his regimen.   He will be in communication with me about how this goes via my chart and will determine follow-up based on his voiding symptoms If he does ultimately get started on Toviaz, would like him to have a bladder scan about a month after initiation of this medication to ensure that he is emptying well All questions answered today  3. Urinary urgency As above  F/u TBD  Hollice Espy, MD  [facility] [address] [place], [place] [address] [phone]

## 2018-01-29 DIAGNOSIS — R69 Illness, unspecified: Secondary | ICD-10-CM | POA: Diagnosis not present

## 2018-03-04 DIAGNOSIS — R69 Illness, unspecified: Secondary | ICD-10-CM | POA: Diagnosis not present

## 2018-06-10 ENCOUNTER — Telehealth: Payer: Self-pay | Admitting: Family Medicine

## 2018-06-10 NOTE — Telephone Encounter (Signed)
Left message asking pt to call office please change his appointment with lisa to lab only at what ever time is best for pt

## 2018-06-11 ENCOUNTER — Other Ambulatory Visit: Payer: Self-pay

## 2018-06-11 ENCOUNTER — Other Ambulatory Visit: Payer: Self-pay | Admitting: Family Medicine

## 2018-06-11 ENCOUNTER — Other Ambulatory Visit (INDEPENDENT_AMBULATORY_CARE_PROVIDER_SITE_OTHER): Payer: Medicare HMO

## 2018-06-11 DIAGNOSIS — E785 Hyperlipidemia, unspecified: Secondary | ICD-10-CM

## 2018-06-11 LAB — COMPREHENSIVE METABOLIC PANEL
ALK PHOS: 62 U/L (ref 39–117)
ALT: 21 U/L (ref 0–53)
AST: 19 U/L (ref 0–37)
Albumin: 4.3 g/dL (ref 3.5–5.2)
BUN: 22 mg/dL (ref 6–23)
CALCIUM: 9.4 mg/dL (ref 8.4–10.5)
CO2: 30 mEq/L (ref 19–32)
Chloride: 103 mEq/L (ref 96–112)
Creatinine, Ser: 1.11 mg/dL (ref 0.40–1.50)
GFR: 66.14 mL/min (ref >60.00)
Glucose, Bld: 101 mg/dL — ABNORMAL HIGH (ref 70–99)
Potassium: 4.8 mEq/L (ref 3.5–5.1)
Sodium: 138 mEq/L (ref 135–145)
TOTAL PROTEIN: 7 g/dL (ref 6.0–8.3)
Total Bilirubin: 0.5 mg/dL (ref 0.2–1.2)

## 2018-06-11 LAB — LIPID PANEL
Cholesterol: 206 mg/dL — ABNORMAL HIGH (ref 0–200)
HDL: 49.1 mg/dL (ref >39.00)
LDL Cholesterol: 146 mg/dL — ABNORMAL HIGH (ref 0–99)
NonHDL: 156.56
TRIGLYCERIDES: 54 mg/dL (ref 0.0–149.0)
Total CHOL/HDL Ratio: 4
VLDL: 10.8 mg/dL (ref 0.0–40.0)

## 2018-06-14 ENCOUNTER — Telehealth: Payer: Self-pay | Admitting: Family Medicine

## 2018-06-14 NOTE — Telephone Encounter (Signed)
Notify patient. Upcoming appointment may need to be postponed due to coronavirus pandemic. Sugar is minimally elevated.  Lipids are similar to previous.  Kidney and liver tests are fine.  I wanted him know about his labs in the meantime.  Thanks.

## 2018-06-15 NOTE — Telephone Encounter (Signed)
Patient advised.

## 2018-06-16 ENCOUNTER — Encounter: Payer: Medicare HMO | Admitting: Family Medicine

## 2018-08-31 ENCOUNTER — Encounter: Payer: Self-pay | Admitting: Family Medicine

## 2018-09-01 DIAGNOSIS — L814 Other melanin hyperpigmentation: Secondary | ICD-10-CM | POA: Diagnosis not present

## 2018-09-01 DIAGNOSIS — L570 Actinic keratosis: Secondary | ICD-10-CM | POA: Diagnosis not present

## 2018-09-01 DIAGNOSIS — D225 Melanocytic nevi of trunk: Secondary | ICD-10-CM | POA: Diagnosis not present

## 2018-09-01 DIAGNOSIS — L821 Other seborrheic keratosis: Secondary | ICD-10-CM | POA: Diagnosis not present

## 2018-09-03 ENCOUNTER — Ambulatory Visit (INDEPENDENT_AMBULATORY_CARE_PROVIDER_SITE_OTHER)
Admission: RE | Admit: 2018-09-03 | Discharge: 2018-09-03 | Disposition: A | Discharge status: Home / Self Care | Payer: Medicare HMO | Source: Ambulatory Visit | Attending: Family Medicine | Admitting: Family Medicine

## 2018-09-03 ENCOUNTER — Other Ambulatory Visit: Payer: Self-pay

## 2018-09-03 ENCOUNTER — Encounter: Payer: Self-pay | Admitting: Family Medicine

## 2018-09-03 ENCOUNTER — Ambulatory Visit (INDEPENDENT_AMBULATORY_CARE_PROVIDER_SITE_OTHER): Payer: Medicare HMO | Admitting: Family Medicine

## 2018-09-03 VITALS — BP 130/82 | HR 69 | Temp 98.1°F | Ht 71.0 in | Wt 206.3 lb

## 2018-09-03 DIAGNOSIS — M25512 Pain in left shoulder: Secondary | ICD-10-CM

## 2018-09-03 DIAGNOSIS — Z7189 Other specified counseling: Secondary | ICD-10-CM

## 2018-09-03 DIAGNOSIS — R05 Cough: Secondary | ICD-10-CM

## 2018-09-03 DIAGNOSIS — M19012 Primary osteoarthritis, left shoulder: Secondary | ICD-10-CM | POA: Diagnosis not present

## 2018-09-03 DIAGNOSIS — Z0000 Encounter for general adult medical examination without abnormal findings: Secondary | ICD-10-CM

## 2018-09-03 DIAGNOSIS — Z1211 Encounter for screening for malignant neoplasm of colon: Secondary | ICD-10-CM

## 2018-09-03 DIAGNOSIS — R059 Cough, unspecified: Secondary | ICD-10-CM

## 2018-09-03 DIAGNOSIS — R399 Unspecified symptoms and signs involving the genitourinary system: Secondary | ICD-10-CM

## 2018-09-03 MED ORDER — FAMOTIDINE 20 MG PO TABS: 20.0000 mg | ORAL_TABLET | Freq: Two times a day (BID) | ORAL | Status: DC

## 2018-09-03 NOTE — Patient Instructions (Addendum)
Go to the lab on the way out.  We'll contact you with your xray report. We make arrangements for referrals, extra imaging, and other appointments based on the urgency of the situation. Referrals are handled based on the clinical situation, not in the order that they are placed. If you do not see one of our referral coordinators on the way out of the clinic today, then you should expect a call in the next 1 to 2 weeks. We work diligently to process all referrals as quickly as possible.    See if pepcid helps the cough.  I'll await GI and ortho input.  Update me as needed.  I would get a flu shot each fall.   The tetanus shot may be cheaper at the pharmacy.  Take care.  Glad to see you.

## 2018-09-03 NOTE — Progress Notes (Signed)
I have personally reviewed the Medicare Annual Wellness questionnaire and have noted 1. The patient's medical and social history 2. Their use of alcohol, tobacco or illicit drugs 3. Their current medications and supplements 4. The patient's functional ability including ADL's, fall risks, home safety risks and hearing or visual             impairment. 5. Diet and physical activities 6. Evidence for depression or mood disorders  The patients weight, height, BMI have been recorded in the chart and visual acuity is per eye clinic.  I have made referrals, counseling and provided education to the patient based review of the above and I have provided the pt with a written personalized care plan for preventive services.  Provider list updated- see scanned forms.  Routine anticipatory guidance given to patient.  See health maintenance. The possibility exists that previously documented standard health maintenance information may have been brought forward from a previous encounter into this note.  If needed, that same information has been updated to reflect the current situation based on today's encounter.    Tetanus d/w pt.  It may be cheaper at the pharmacy, d/w pt Flu shot done yearly.     PNA shot d/w pt.  Defer given cough 08/2018.   zostavax prev done, shingrix out of stock.   Colonoscopy 2010, due fall 2020.  Referred 2020.   Prostate cancer screening d/w pt. see below.  Living will prev done. Wife designated if patient were incapacitated. HCV screening done, neg. AAA screen prev neg.   Intentional weight loss noted.  D/w pt about labs.   He has seen urology, I'll defer.  He agrees.    Cough, usually dry, scant occ phlegm.  Noted more after eating and occ at night.  Not SOB.  No CP.  Already on claritin.  Going on for 4 months.    L shoulder pain with restriction of ROM due to pain.  Pain sleeping on L side.  Pain reaching overhead.  No pain with with R handed golf swing but that is  with compensation.  H/o L humerus fx noted.    PMH and SH reviewed  Meds, vitals, and allergies reviewed.   ROS: Per HPI.  Unless specifically indicated otherwise in HPI, the patient denies:  General: fever. Eyes: acute vision changes ENT: sore throat Cardiovascular: chest pain Respiratory: SOB GI: vomiting GU: dysuria Musculoskeletal: acute back pain Derm: acute rash Neuro: acute motor dysfunction Psych: worsening mood Endocrine: polydipsia Heme: bleeding Allergy: hayfever  GEN: nad, alert and oriented HEENT: NCAT NECK: supple w/o LA CV: rrr. PULM: ctab, no inc wob ABD: soft, +bs EXT: no edema SKIN: no acute rash  L shoulder with pain on ext rotation and dec in ROM with abduction automatic scapular compensation.  Distally neurovascular intact. Grip within normal limits.

## 2018-09-06 DIAGNOSIS — M25512 Pain in left shoulder: Secondary | ICD-10-CM | POA: Insufficient documentation

## 2018-09-06 NOTE — Assessment & Plan Note (Signed)
He has seen urology, I'll defer.  He agrees.

## 2018-09-06 NOTE — Assessment & Plan Note (Signed)
See notes on chest x-ray.  This could be GERD related, no ominous findings on exam.  Okay for outpatient follow-up.  Can try Pepcid and then update me as needed.  We are going to refer him to GI anyway for colon cancer screening.

## 2018-09-06 NOTE — Assessment & Plan Note (Signed)
Tetanus d/w pt.  It may be cheaper at the pharmacy, d/w pt Flu shot done yearly.     PNA shot d/w pt.  Defer given cough 08/2018.   zostavax prev done, shingrix out of stock.   Colonoscopy 2010, due fall 2020.  Referred 2020.   Prostate cancer screening d/w pt. see below.  Living will prev done. Wife designated if patient were incapacitated. HCV screening done, neg. AAA screen prev neg.

## 2018-09-06 NOTE — Assessment & Plan Note (Signed)
Living will prev done. Wife designated if patient were incapacitated.

## 2018-09-06 NOTE — Assessment & Plan Note (Signed)
He has restriction in range of motion and it could be due to cuff symptoms and also capsulitis related to previous injury.  Discussed with patient about options.  Refer to orthopedics.  See notes on imaging.

## 2018-09-08 ENCOUNTER — Telehealth: Payer: Self-pay | Admitting: Family Medicine

## 2018-09-08 NOTE — Telephone Encounter (Signed)
Spoke with patient.

## 2018-09-08 NOTE — Telephone Encounter (Signed)
Best number [phone]  Pt returned your call

## 2018-09-18 DIAGNOSIS — M19012 Primary osteoarthritis, left shoulder: Secondary | ICD-10-CM | POA: Diagnosis not present

## 2018-09-20 ENCOUNTER — Encounter: Payer: Self-pay | Admitting: Family Medicine

## 2018-10-01 ENCOUNTER — Telehealth: Payer: Self-pay

## 2018-10-01 NOTE — Telephone Encounter (Signed)
Returned patients phone call to schedule his colonoscopy this am. He called yesterday 09/30/18 to schedule.  Thanks [employer]

## 2018-10-08 ENCOUNTER — Telehealth: Payer: Self-pay

## 2018-10-08 NOTE — Telephone Encounter (Signed)
Returned pts call to schedule colonoscopy.  LVM letting him know that I will be in clinic today and may not be able to get back with him until this afternoon or Friday to schedule.  Thanks [employer]

## 2018-10-15 ENCOUNTER — Other Ambulatory Visit: Payer: Self-pay

## 2018-10-15 ENCOUNTER — Telehealth: Payer: Self-pay

## 2018-10-15 NOTE — Telephone Encounter (Signed)
Returned patients call to schedule screening colonoscopy.  During triage patient stated that he would like to have an EGD due to a "continuous cough, and sensitivity in his throat especially with cold foods".  Appt has been scheduled with Dr. Vicente Males  On August 31st.  Thanks,  Sharyn Lull, [place]

## 2018-10-15 NOTE — Telephone Encounter (Signed)
Would need an office visit to discuss

## 2018-11-17 ENCOUNTER — Telehealth: Payer: Self-pay | Admitting: Family Medicine

## 2018-11-17 NOTE — Telephone Encounter (Signed)
Patient called to schedule Flu Vaccine. He wanted to check to see if he had his Shingrix Booster done already and when ?

## 2018-11-17 NOTE — Telephone Encounter (Signed)
Patient advised that Zostavax was given in April 2016 but a new vaccine has been introduced since that time that requires an initial injection and a second injection after that.  Patient will check with his insurance to see whether it will cover the expense at the office or at the pharmacy.

## 2018-11-19 ENCOUNTER — Ambulatory Visit (INDEPENDENT_AMBULATORY_CARE_PROVIDER_SITE_OTHER): Payer: Medicare HMO

## 2018-11-19 DIAGNOSIS — Z23 Encounter for immunization: Secondary | ICD-10-CM

## 2018-11-23 ENCOUNTER — Ambulatory Visit (INDEPENDENT_AMBULATORY_CARE_PROVIDER_SITE_OTHER): Payer: Medicare HMO | Admitting: Gastroenterology

## 2018-11-23 DIAGNOSIS — Z1211 Encounter for screening for malignant neoplasm of colon: Secondary | ICD-10-CM | POA: Diagnosis not present

## 2018-11-23 DIAGNOSIS — R05 Cough: Secondary | ICD-10-CM

## 2018-11-23 DIAGNOSIS — R053 Chronic cough: Secondary | ICD-10-CM

## 2018-11-23 MED ORDER — OMEPRAZOLE 40 MG PO CPDR: 40.0000 mg | DELAYED_RELEASE_CAPSULE | Freq: Every day | ORAL | 3 refills | Status: DC

## 2018-11-23 NOTE — Progress Notes (Signed)
Dalton Delacruz  [address]  [place]  [place], [place] [address]  Main: [phone]  Fax: [phone]   Gastroenterology Consultation  Referring Provider:     Tonia Ghent, MD Primary Care Physician:  Tonia Ghent, MD Reason for Consultation:     Colonoscopy        HPI:   Virtual Visit via Telephone Note  I connected with patient on 11/23/18 at  2:15 PM EDT by telephone and verified that I am speaking with the correct person using two identifiers.   I discussed the limitations, risks, security and privacy concerns of performing an evaluation and management service by telephone and the availability of in person appointments. I also discussed with the patient that there may be a patient responsible charge related to this service. The patient expressed understanding and agreed to proceed.  Location of the patient: Home Location of provider: Home Participating persons: Patient and provider only   History of Present Illness:    Dalton Delacruz is a 68 y.o. y/o male referred for consultation & management  by Dr. Tonia Ghent, MD.      Last colonoscopy per epic in 2010 has been documented as normal.  He initially wanted to set up a colonoscopy but also had upper GI symptoms and hence wanted to have an office visit.  Since he is having cough it was changed to telephone visit in view of COVID . He states that the cough has been going on for over a year it is always in the morning but the first 20 minutes when he wakes up sometimes produces some phlegm.  Gets better through the day.  He denies any typical symptoms of acid reflux such as heartburn or regurgitation of acid.  He takes Pepcid 20 mg twice a day with no significant improvement in symptoms.  He does suffer from allergies to hayfever, tomatoes.  No personal history of asthma or family history of asthma.  When I asked him if he had difficulty swallowing he said that that the he does notice that his swallowing is  not as good as it used to be.  He is more conscious of his swallowing than he used to be.  He denies any change in his bowel movements or blood in his stool or change in shape of his stool.  Past Medical History:  Diagnosis Date   Displaced spiral fracture of shaft of left humerus ~ 2002   GERD (gastroesophageal reflux disease)    with LPR   Hypertension     Past Surgical History:  Procedure Laterality Date   CATARACT EXTRACTION Bilateral    2015   COLONOSCOPY     2010   FISTULOTOMY  03/04/13   VASECTOMY      Prior to Admission medications   Medication Sig Start Date End Date Taking? Authorizing Provider  aspirin EC 81 MG tablet Take 1 tablet (81 mg total) by mouth daily. 03/20/11   Larey Dresser, MD  famotidine (PEPCID) 20 MG tablet Take 1 tablet (20 mg total) by mouth 2 (two) times daily. 09/03/18   Tonia Ghent, MD  fesoterodine (TOVIAZ) 8 MG TB24 tablet Take 1 tablet (8 mg total) by mouth daily. 12/16/17   Hollice Espy, MD  loratadine (CLARITIN) 10 MG tablet Take 1 tablet (10 mg total) by mouth daily. 12/31/16   Tonia Ghent, MD    Family History  Problem Relation Age of Onset   Cancer Mother  Breast   Diabetes Mother    Hyperlipidemia Mother    Hypertension Mother    Heart disease Mother    Ulcers Father        stomach ulcers   Heart disease Father    Diabetes Brother    Prostate cancer Neg Hx    Colon cancer Neg Hx      Social History   Tobacco Use   Smoking status: Former Smoker    Packs/day: 1.00    Years: 5.00    Pack years: 5.00    Types: Cigars    Quit date: 03/25/2010    Years since quitting: 8.6   Smokeless tobacco: Never Used  Substance Use Topics   Alcohol use: Yes    Alcohol/week: 0.0 standard drinks    Comment: A few drinks a week   Drug use: No    Allergies as of 11/23/2018 - Review Complete 09/03/2018  Allergen Reaction Noted   Lisinopril  02/19/2012    Review of Systems:    All systems  reviewed and negative except where noted in HPI.   Observations/Objective:  Labs: CBC    Component Value Date/Time   WBC 8.7 07/19/2014 1052   WBC 6.5 03/08/2011 1014   RBC 4.66 07/19/2014 1052   RBC 4.39 03/08/2011 1014   HGB 14.7 07/19/2014 1052   HCT 43.7 07/19/2014 1052   PLT 263 07/19/2014 1052   MCV 94 07/19/2014 1052   MCH 31.5 07/19/2014 1052   MCHC 33.6 07/19/2014 1052   MCHC 34.9 03/08/2011 1014   RDW 12.5 07/19/2014 1052   LYMPHSABS 1.4 07/19/2014 1052   MONOABS 1.1 (H) 07/19/2014 1052   EOSABS 0.1 07/19/2014 1052   BASOSABS 0.0 07/19/2014 1052   CMP     Component Value Date/Time   NA 138 06/11/2018 1219   NA 138 07/19/2014 1052   K 4.8 06/11/2018 1219   K 4.2 07/19/2014 1052   CL 103 06/11/2018 1219   CL 103 07/19/2014 1052   CO2 30 06/11/2018 1219   CO2 29 07/19/2014 1052   GLUCOSE 101 (H) 06/11/2018 1219   GLUCOSE 113 (H) 07/19/2014 1052   BUN 22 06/11/2018 1219   BUN 20 07/19/2014 1052   CREATININE 1.11 06/11/2018 1219   CREATININE 1.05 07/19/2014 1052   CALCIUM 9.4 06/11/2018 1219   CALCIUM 9.1 07/19/2014 1052   PROT 7.0 06/11/2018 1219   PROT 7.2 07/19/2014 1052   ALBUMIN 4.3 06/11/2018 1219   ALBUMIN 4.2 07/19/2014 1052   AST 19 06/11/2018 1219   AST 26 07/19/2014 1052   ALT 21 06/11/2018 1219   ALT 28 07/19/2014 1052   ALKPHOS 62 06/11/2018 1219   ALKPHOS 58 07/19/2014 1052   BILITOT 0.5 06/11/2018 1219   BILITOT 0.6 07/19/2014 1052   GFRNONAA >60 07/19/2014 1052   GFRAA >60 07/19/2014 1052    Imaging Studies: No results found.  Assessment and Plan:   Dalton Delacruz is a 68 y.o. y/o male has been referred for a colonoscopy and possible upper endoscopy.  Colonoscopy for colon cancer screening average risk.  It is possible that his chronic cough in the morning is due to acid reflux.  I will empirically treat him with Prilosec 40 mg once a day and watch for any improvement.  If he does improve then it would make sense that all his  symptoms are due to acid reflux which is mainly nocturnal.  If his symptoms do not improve then we have to not only look for  other causes of chronic cough including pulmonary/cardiac but will also take biopsies during his upper endoscopy to rule out EOE.  I have discussed alternative options, risks & benefits,  which include, but are not limited to, bleeding, infection, perforation,respiratory complication & drug reaction.  The patient agrees with this plan & written consent will be obtained.     I discussed the assessment and treatment plan with the patient. The patient was provided an opportunity to ask questions and all were answered. The patient agreed with the plan and demonstrated an understanding of the instructions.   The patient was advised to call back or seek an in-person evaluation if the symptoms worsen or if the condition fails to improve as anticipated.  Follow-up in 4 to 5 weeks in office visit. I provided 15 minutes of non-face-to-face time during this encounter.   Dr Dalton Bellows MD,MRCP [facility]) Gastroenterology/Hepatology Pager: [phone]   Speech recognition software was used to dictate the above note.

## 2018-11-26 ENCOUNTER — Other Ambulatory Visit: Payer: Self-pay

## 2018-11-26 DIAGNOSIS — Z1211 Encounter for screening for malignant neoplasm of colon: Secondary | ICD-10-CM

## 2018-11-26 DIAGNOSIS — R05 Cough: Secondary | ICD-10-CM

## 2018-11-26 DIAGNOSIS — R053 Chronic cough: Secondary | ICD-10-CM

## 2018-11-27 DIAGNOSIS — M19012 Primary osteoarthritis, left shoulder: Secondary | ICD-10-CM | POA: Diagnosis not present

## 2018-12-03 ENCOUNTER — Other Ambulatory Visit: Payer: Self-pay | Admitting: Orthopedic Surgery

## 2018-12-03 DIAGNOSIS — G8929 Other chronic pain: Secondary | ICD-10-CM

## 2018-12-11 ENCOUNTER — Other Ambulatory Visit: Payer: Self-pay

## 2018-12-11 ENCOUNTER — Other Ambulatory Visit
Admission: RE | Admit: 2018-12-11 | Discharge: 2018-12-11 | Disposition: A | Discharge status: Home / Self Care | Payer: Medicare HMO | Source: Ambulatory Visit | Attending: Gastroenterology | Admitting: Gastroenterology

## 2018-12-11 DIAGNOSIS — Z20828 Contact with and (suspected) exposure to other viral communicable diseases: Secondary | ICD-10-CM | POA: Insufficient documentation

## 2018-12-11 DIAGNOSIS — Z01812 Encounter for preprocedural laboratory examination: Secondary | ICD-10-CM | POA: Diagnosis not present

## 2018-12-11 LAB — SARS CORONAVIRUS 2 (TAT 6-24 HRS): SARS Coronavirus 2: NEGATIVE

## 2018-12-15 ENCOUNTER — Other Ambulatory Visit: Payer: Self-pay

## 2018-12-15 ENCOUNTER — Encounter
Admission: RE | Disposition: A | Discharge status: Home / Self Care | Payer: Self-pay | Source: Home / Self Care | Attending: Gastroenterology

## 2018-12-15 ENCOUNTER — Ambulatory Visit: Payer: Medicare HMO | Admitting: Anesthesiology

## 2018-12-15 ENCOUNTER — Ambulatory Visit
Admission: RE | Admit: 2018-12-15 | Discharge: 2018-12-15 | Disposition: A | Discharge status: Home / Self Care | Payer: Medicare HMO | Attending: Gastroenterology | Admitting: Gastroenterology

## 2018-12-15 DIAGNOSIS — R053 Chronic cough: Secondary | ICD-10-CM

## 2018-12-15 DIAGNOSIS — Z79899 Other long term (current) drug therapy: Secondary | ICD-10-CM | POA: Diagnosis not present

## 2018-12-15 DIAGNOSIS — Z87891 Personal history of nicotine dependence: Secondary | ICD-10-CM | POA: Insufficient documentation

## 2018-12-15 DIAGNOSIS — Z1211 Encounter for screening for malignant neoplasm of colon: Secondary | ICD-10-CM | POA: Insufficient documentation

## 2018-12-15 DIAGNOSIS — R05 Cough: Secondary | ICD-10-CM | POA: Diagnosis not present

## 2018-12-15 DIAGNOSIS — I10 Essential (primary) hypertension: Secondary | ICD-10-CM | POA: Diagnosis not present

## 2018-12-15 DIAGNOSIS — R1310 Dysphagia, unspecified: Secondary | ICD-10-CM

## 2018-12-15 DIAGNOSIS — D123 Benign neoplasm of transverse colon: Secondary | ICD-10-CM | POA: Insufficient documentation

## 2018-12-15 DIAGNOSIS — D126 Benign neoplasm of colon, unspecified: Secondary | ICD-10-CM | POA: Diagnosis not present

## 2018-12-15 DIAGNOSIS — K219 Gastro-esophageal reflux disease without esophagitis: Secondary | ICD-10-CM | POA: Insufficient documentation

## 2018-12-15 DIAGNOSIS — K635 Polyp of colon: Secondary | ICD-10-CM | POA: Diagnosis not present

## 2018-12-15 DIAGNOSIS — Z7982 Long term (current) use of aspirin: Secondary | ICD-10-CM | POA: Diagnosis not present

## 2018-12-15 HISTORY — PX: ESOPHAGOGASTRODUODENOSCOPY (EGD) WITH PROPOFOL: SHX5813

## 2018-12-15 HISTORY — PX: COLONOSCOPY WITH PROPOFOL: SHX5780

## 2018-12-15 SURGERY — COLONOSCOPY WITH PROPOFOL
Anesthesia: General

## 2018-12-15 MED ORDER — PROPOFOL 500 MG/50ML IV EMUL
INTRAVENOUS | Status: DC | PRN
  Administered 2018-12-15: 175 ug/kg/min via INTRAVENOUS

## 2018-12-15 MED ORDER — LIDOCAINE HCL (CARDIAC) PF 100 MG/5ML IV SOSY
PREFILLED_SYRINGE | INTRAVENOUS | Status: DC | PRN
  Administered 2018-12-15: 50 mg via INTRAVENOUS

## 2018-12-15 MED ORDER — LIDOCAINE HCL (PF) 2 % IJ SOLN
INTRAMUSCULAR | Status: AC
  Filled 2018-12-15: qty 10

## 2018-12-15 MED ORDER — PROPOFOL 500 MG/50ML IV EMUL
INTRAVENOUS | Status: AC
  Filled 2018-12-15: qty 50

## 2018-12-15 MED ORDER — PROPOFOL 10 MG/ML IV BOLUS
INTRAVENOUS | Status: DC | PRN
  Administered 2018-12-15: 60 mg via INTRAVENOUS

## 2018-12-15 MED ORDER — SODIUM CHLORIDE 0.9 % IV SOLN
INTRAVENOUS | Status: DC
  Administered 2018-12-15: 1000 mL via INTRAVENOUS

## 2018-12-15 MED ORDER — PHENYLEPHRINE HCL (PRESSORS) 10 MG/ML IV SOLN
INTRAVENOUS | Status: DC | PRN
  Administered 2018-12-15: 100 ug via INTRAVENOUS

## 2018-12-15 NOTE — Anesthesia Post-op Follow-up Note (Signed)
Anesthesia QCDR form completed.        

## 2018-12-15 NOTE — Anesthesia Postprocedure Evaluation (Signed)
Anesthesia Post Note  Patient: Dalton Delacruz  Procedure(s) Performed: COLONOSCOPY WITH PROPOFOL (N/A ) ESOPHAGOGASTRODUODENOSCOPY (EGD) WITH PROPOFOL (N/A )  Patient location during evaluation: Endoscopy Anesthesia Type: General Level of consciousness: awake and alert and oriented Pain management: pain level controlled Vital Signs Assessment: post-procedure vital signs reviewed and stable Respiratory status: spontaneous breathing, nonlabored ventilation and respiratory function stable Cardiovascular status: blood pressure returned to baseline and stable Postop Assessment: no signs of nausea or vomiting Anesthetic complications: no     Last Vitals:  Vitals:   12/15/18 1010 12/15/18 1020  BP: 118/72 128/77  Pulse: (!) 53 (!) 51  Resp: 16 12  Temp:    SpO2: 100% 100%    Last Pain:  Vitals:   12/15/18 0940  TempSrc: Tympanic  PainSc:                  Furman Trentman

## 2018-12-15 NOTE — Anesthesia Preprocedure Evaluation (Signed)
Anesthesia Evaluation  Patient identified by MRN, date of birth, ID band Patient awake    Reviewed: Allergy & Precautions, NPO status , Patient's Chart, lab work & pertinent test results  History of Anesthesia Complications Negative for: history of anesthetic complications  Airway Mallampati: II  TM Distance: >3 FB Neck ROM: Full    Dental no notable dental hx.    Pulmonary neg pulmonary ROS, neg sleep apnea, neg COPD, former smoker,    breath sounds clear to auscultation- rhonchi (-) wheezing      Cardiovascular hypertension, Pt. on medications (-) CAD, (-) Past MI, (-) Cardiac Stents and (-) CABG  Rhythm:Regular Rate:Normal - Systolic murmurs and - Diastolic murmurs    Neuro/Psych neg Seizures negative neurological ROS  negative psych ROS   GI/Hepatic Neg liver ROS, GERD  ,  Endo/Other  negative endocrine ROSneg diabetes  Renal/GU negative Renal ROS     Musculoskeletal negative musculoskeletal ROS (+)   Abdominal (+) - obese,   Peds  Hematology negative hematology ROS (+)   Anesthesia Other Findings Past Medical History: ~ 2002: Displaced spiral fracture of shaft of left humerus No date: GERD (gastroesophageal reflux disease)     Comment:  with LPR No date: Hypertension   Reproductive/Obstetrics                             Anesthesia Physical Anesthesia Plan  ASA: II  Anesthesia Plan: General   Post-op Pain Management:    Induction: Intravenous  PONV Risk Score and Plan: 1 and Propofol infusion  Airway Management Planned: Natural Airway  Additional Equipment:   Intra-op Plan:   Post-operative Plan:   Informed Consent: I have reviewed the patients History and Physical, chart, labs and discussed the procedure including the risks, benefits and alternatives for the proposed anesthesia with the patient or authorized representative who has indicated his/her understanding and  acceptance.     Dental advisory given  Plan Discussed with: CRNA and Anesthesiologist  Anesthesia Plan Comments:         Anesthesia Quick Evaluation

## 2018-12-15 NOTE — Anesthesia Procedure Notes (Signed)
Date/Time: 12/15/2018 9:17 AM Performed by: Johnna Acosta, CRNA Pre-anesthesia Checklist: Patient identified, Emergency Drugs available, Suction available, Patient being monitored and Timeout performed Patient Re-evaluated:Patient Re-evaluated prior to induction Oxygen Delivery Method: Nasal cannula Preoxygenation: Pre-oxygenation with 100% oxygen Induction Type: IV induction

## 2018-12-15 NOTE — Op Note (Signed)
[facility]enterology Patient Name: Dalton Delacruz Procedure Date: 12/15/2018 9:08 AM MRN: [id number] Account #: [id number] Date of Birth: 1950-12-18 Admit Type: Outpatient Age: 68 Room: [facility] ENDO ROOM 1 Gender: Male Note Status: Finalized Procedure:            Colonoscopy Indications:          Screening for colorectal malignant neoplasm Providers:            [name] MD, MD Medicines:            Monitored Anesthesia Care Complications:        No immediate complications. Procedure:            Pre-Anesthesia Assessment:                       - Prior to the procedure, a History and Physical was                        performed, and patient medications, allergies and                        sensitivities were reviewed. The patient's tolerance of                        previous anesthesia was reviewed.                       - The risks and benefits of the procedure and the                        sedation options and risks were discussed with the                        patient. All questions were answered and informed                        consent was obtained.                       - ASA Grade Assessment: II - A patient with mild                        systemic disease.                       After obtaining informed consent, the colonoscope was                        passed under direct vision. Throughout the procedure,                        the patient's blood pressure, pulse, and oxygen                        saturations were monitored continuously. The                        Colonoscope was introduced through the anus and                        advanced to the the cecum, identified by the  appendiceal orifice. The colonoscopy was performed with                        ease. The patient tolerated the procedure well. The                        quality of the bowel preparation was excellent. Findings:      The perianal and digital  rectal examinations were normal.      A 6 mm polyp was found in the proximal transverse colon. The polyp was       sessile. The polyp was removed with a cold snare. Resection and       retrieval were complete.      The exam was otherwise without abnormality on direct and retroflexion       views. Impression:           - One 6 mm polyp in the proximal transverse colon,                        removed with a cold snare. Resected and retrieved.                       - The examination was otherwise normal on direct and                        retroflexion views. Recommendation:       - Discharge patient to home (with escort).                       - Resume previous diet.                       - Continue present medications.                       - Await pathology results.                       - Repeat colonoscopy for surveillance based on                        pathology results.                       - Return to GI office as previously scheduled. Procedure Code(s):    --- Professional ---                       [phone], Colonoscopy, flexible; with removal of tumor(s),                        polyp(s), or other lesion(s) by snare technique Diagnosis Code(s):    --- Professional ---                       Z12.11, Encounter for screening for malignant neoplasm                        of colon                       K63.5, Polyp of colon CPT copyright 2019 American Medical Association. All rights reserved. The codes  documented in this report are preliminary and upon coder review may  be revised to meet current compliance requirements. [name], MD [name] MD, MD 12/15/2018 9:43:03 AM This report has been signed electronically. Number of Addenda: 0 Note Initiated On: 12/15/2018 9:08 AM Scope Withdrawal Time: 0 hours 10 minutes 4 seconds  Total Procedure Duration: 0 hours 11 minutes 29 seconds  Estimated Blood Loss: Estimated blood loss: none.      [facility]

## 2018-12-15 NOTE — Transfer of Care (Signed)
Immediate Anesthesia Transfer of Care Note  Patient: Dalton Delacruz  Procedure(s) Performed: COLONOSCOPY WITH PROPOFOL (N/A ) ESOPHAGOGASTRODUODENOSCOPY (EGD) WITH PROPOFOL (N/A )  Patient Location: PACU  Anesthesia Type:General  Level of Consciousness: sedated  Airway & Oxygen Therapy: Patient Spontanous Breathing and Patient connected to nasal cannula oxygen  Post-op Assessment: Report given to RN and Post -op Vital signs reviewed and stable  Post vital signs: Reviewed and stable  Last Vitals:  Vitals Value Taken Time  BP 102/69 12/15/18 0946  Temp    Pulse 56 12/15/18 0947  Resp 12 12/15/18 0947  SpO2 99 % 12/15/18 0947  Vitals shown include unvalidated device data.  Last Pain:  Vitals:   12/15/18 0940  TempSrc: Tympanic  PainSc:          Complications: No apparent anesthesia complications

## 2018-12-15 NOTE — Op Note (Signed)
[facility] Gastroenterology Patient Name: Dalton Delacruz Procedure Date: 12/15/2018 9:09 AM MRN: [id number] Account #: [id number] Date of Birth: May 05, 1950 Admit Type: Outpatient Age: 68 Room: [facility] ENDO ROOM 1 Gender: Male Note Status: Finalized Procedure:            Upper GI endoscopy Indications:          Dysphagia Providers:            [name] MD, MD Medicines:            Monitored Anesthesia Care Complications:        No immediate complications. Procedure:            Pre-Anesthesia Assessment:                       - Prior to the procedure, a History and Physical was                        performed, and patient medications, allergies and                        sensitivities were reviewed. The patient's tolerance of                        previous anesthesia was reviewed.                       - The risks and benefits of the procedure and the                        sedation options and risks were discussed with the                        patient. All questions were answered and informed                        consent was obtained.                       - ASA Grade Assessment: II - A patient with mild                        systemic disease.                       After obtaining informed consent, the endoscope was                        passed under direct vision. Throughout the procedure,                        the patient's blood pressure, pulse, and oxygen                        saturations were monitored continuously. The Endoscope                        was introduced through the mouth, and advanced to the                        third part of duodenum. The upper GI endoscopy was  accomplished with ease. The patient tolerated the                        procedure well. Findings:      The examined esophagus was normal. Biopsies were obtained from the       proximal and distal esophagus with cold forceps for histology of   suspected eosinophilic esophagitis.      The stomach was normal.      The examined duodenum was normal.      The cardia and gastric fundus were normal on retroflexion. Impression:           - Normal esophagus. Biopsied.                       - Normal stomach.                       - Normal examined duodenum. Recommendation:       - Await pathology results.                       - Perform a colonoscopy today. Procedure Code(s):    --- Professional ---                       [phone], Esophagogastroduodenoscopy, flexible, transoral;                        with biopsy, single or multiple Diagnosis Code(s):    --- Professional ---                       R13.10, Dysphagia, unspecified CPT copyright 2019 American Medical Association. All rights reserved. The codes documented in this report are preliminary and upon coder review may  be revised to meet current compliance requirements. [name], MD [name] MD, MD 12/15/2018 9:25:36 AM This report has been signed electronically. Number of Addenda: 0 Note Initiated On: 12/15/2018 9:09 AM Estimated Blood Loss: Estimated blood loss: none.      [facility]

## 2018-12-15 NOTE — H&P (Signed)
[name], MD [address] [place], [place], [place], [address] [address], [place], [place], [place], [address] Phone: [phone]  Fax: [phone]  Primary Care Physician:  Tonia Ghent, MD   Pre-Procedure History & Physical: HPI:  Dalton Delacruz is a 68 y.o. male is here for an endoscopy and colonoscopy    Past Medical History:  Diagnosis Date  . Displaced spiral fracture of shaft of left humerus ~ 2002  . GERD (gastroesophageal reflux disease)    with LPR  . Hypertension     Past Surgical History:  Procedure Laterality Date  . CATARACT EXTRACTION Bilateral    2015  . COLONOSCOPY     2010  . FISTULOTOMY  03/04/13  . VASECTOMY      Prior to Admission medications   Medication Sig Start Date End Date Taking? Authorizing Provider  aspirin EC 81 MG tablet Take 1 tablet (81 mg total) by mouth daily. 03/20/11  Yes Larey Dresser, MD  famotidine (PEPCID) 20 MG tablet Take 1 tablet (20 mg total) by mouth 2 (two) times daily. 09/03/18  Yes Tonia Ghent, MD  fesoterodine (TOVIAZ) 8 MG TB24 tablet Take 1 tablet (8 mg total) by mouth daily. 12/16/17  Yes Hollice Espy, MD  loratadine (CLARITIN) 10 MG tablet Take 1 tablet (10 mg total) by mouth daily. 12/31/16  Yes Tonia Ghent, MD  omeprazole (PRILOSEC) 40 MG capsule Take 1 capsule (40 mg total) by mouth daily. 11/23/18 02/22/19 Yes [name], MD    Allergies as of 11/27/2018 - Review Complete 09/03/2018  Allergen Reaction Noted  . Lisinopril  02/19/2012    Family History  Problem Relation Age of Onset  . Cancer Mother        Breast  . Diabetes Mother   . Hyperlipidemia Mother   . Hypertension Mother   . Heart disease Mother   . Ulcers Father        stomach ulcers  . Heart disease Father   . Diabetes Brother   . Prostate cancer Neg Hx   . Colon cancer Neg Hx     Social History   Socioeconomic History  . Marital status: Married    Spouse name: Not on file  . Number of children: 3   . Years of education: Not on file  . Highest education level: Not on file  Occupational History  . Occupation: VP     Comment: Engineer, water  Social Needs  . Financial resource strain: Not on file  . Food insecurity    Worry: Not on file    Inability: Not on file  . Transportation needs    Medical: Not on file    Non-medical: Not on file  Tobacco Use  . Smoking status: Former Smoker    Packs/day: 1.00    Years: 5.00    Pack years: 5.00    Types: Cigars    Quit date: 03/25/2010    Years since quitting: 8.7  . Smokeless tobacco: Never Used  Substance and Sexual Activity  . Alcohol use: Yes    Alcohol/week: 0.0 standard drinks    Comment: A few drinks a week  . Drug use: No  . Sexual activity: Not on file  Lifestyle  . Physical activity    Days per week: Not on file    Minutes per session: Not on file  . Stress: Not on file  Relationships  . Social Herbalist on phone:  Not on file    Gets together: Not on file    Attends religious service: Not on file    Active member of club or organization: Not on file    Attends meetings of clubs or organizations: Not on file    Relationship status: Not on file  . Intimate partner violence    Fear of current or ex partner: Not on file    Emotionally abused: Not on file    Physically abused: Not on file    Forced sexual activity: Not on file  Other Topics Concern  . 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     Married 1975, 3 kids, 5 grandkids    Review of Systems: See HPI, otherwise negative ROS  Physical Exam: BP (!) 145/101   Pulse (!) 59   Temp (!) 96.2 F (35.7 C) (Tympanic)   Resp 20   Ht 6' (1.829 m)    Wt 93 kg   SpO2 99%   BMI 27.80 kg/m  General:   Alert,  pleasant and cooperative in NAD Head:  Normocephalic and atraumatic. Neck:  Supple; no masses or thyromegaly. Lungs:  Clear throughout to auscultation, normal respiratory effort.    Heart:  +S1, +S2, Regular rate and rhythm, No edema. Abdomen:  Soft, nontender and nondistended. Normal bowel sounds, without guarding, and without rebound.   Neurologic:  Alert and  oriented x4;  grossly normal neurologically.  Impression/Plan: Dalton Delacruz is here for an endoscopy and colonoscopy  to be performed for  evaluation of dysphagia and colon cancer screening     Risks, benefits, limitations, and alternatives regarding endoscopy have been reviewed with the patient.  Questions have been answered.  All parties agreeable.   [name], MD  12/15/2018, 9:05 AM

## 2018-12-16 ENCOUNTER — Encounter: Payer: Self-pay | Admitting: Gastroenterology

## 2018-12-16 LAB — SURGICAL PATHOLOGY

## 2018-12-17 ENCOUNTER — Other Ambulatory Visit: Payer: Self-pay

## 2018-12-17 ENCOUNTER — Ambulatory Visit
Admission: RE | Admit: 2018-12-17 | Discharge: 2018-12-17 | Disposition: A | Discharge status: Home / Self Care | Payer: Medicare HMO | Source: Ambulatory Visit | Attending: Orthopedic Surgery | Admitting: Orthopedic Surgery

## 2018-12-17 DIAGNOSIS — G8929 Other chronic pain: Secondary | ICD-10-CM

## 2018-12-17 DIAGNOSIS — Z01818 Encounter for other preprocedural examination: Secondary | ICD-10-CM | POA: Diagnosis not present

## 2018-12-17 DIAGNOSIS — M19012 Primary osteoarthritis, left shoulder: Secondary | ICD-10-CM | POA: Diagnosis not present

## 2018-12-17 DIAGNOSIS — M25512 Pain in left shoulder: Secondary | ICD-10-CM

## 2019-01-05 ENCOUNTER — Other Ambulatory Visit: Payer: Self-pay | Admitting: Orthopedic Surgery

## 2019-01-06 ENCOUNTER — Ambulatory Visit: Payer: Medicare HMO | Admitting: Gastroenterology

## 2019-01-06 ENCOUNTER — Other Ambulatory Visit: Payer: Self-pay

## 2019-01-06 ENCOUNTER — Encounter: Payer: Self-pay | Admitting: Gastroenterology

## 2019-01-06 VITALS — BP 151/87 | HR 58 | Temp 97.5°F | Ht 71.0 in | Wt 208.0 lb

## 2019-01-06 DIAGNOSIS — R05 Cough: Secondary | ICD-10-CM

## 2019-01-06 DIAGNOSIS — R053 Chronic cough: Secondary | ICD-10-CM

## 2019-01-06 NOTE — Progress Notes (Signed)
[name] MD, MRCP(U.K) [address]  [place]  [place], [place] [address]  Main: [phone]  Fax: [phone]   Primary Care Physician: Dalton Ghent, MD  Primary Gastroenterologist:  Dr. [name]   Chief Complaint  Patient presents with  . Follow-up    chronic cough    HPI: Dalton Delacruz is a 68 y.o. male    Initially seen on 11/23/2018 for a colonoscopy and upper GI symptoms.  He had been having cough for over a year usually in the morning for the first 20 minutes when he wakes up.  Denies any typical symptoms for acid reflux and was on Pepcid twice a day 20 mg.  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  His swallowing was not as good as it used to be.  Plan was to empirically treat him with Prilosec 40 mg once a day and watch for improvement.  At the time of his colonoscopy symptoms are not better so hence we proceeded with a upper endoscopy as well.  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  EGD also performed at the same time showed no abnormality and biopsies esophagus showed no abnormalities.  Since starting Prilosec 40 mg a day he has found no benefit.  He still coughs.  Some sensation of dysphagia.  No other issue.  Does attest to features suggestive of postnasal drip at night  Current Outpatient Medications  Medication Sig Dispense Refill  . aspirin EC 81 MG tablet Take 1 tablet (81 mg total) by mouth daily.    . famotidine (PEPCID) 20 MG tablet Take 1 tablet (20 mg total) by mouth 2 (two) times daily.    . fesoterodine (TOVIAZ) 8 MG TB24 tablet Take 1 tablet (8 mg total) by mouth daily. 30 tablet 11  . loratadine (CLARITIN) 10 MG tablet Take 1 tablet (10 mg total) by mouth daily.    Marland Kitchen omeprazole (PRILOSEC) 40 MG capsule Take 1 capsule (40 mg total) by mouth daily. 90 capsule 3   No current facility-administered medications for this visit.     Allergies as of 01/06/2019 - Review Complete 01/06/2019   Allergen Reaction Noted  . Lisinopril  02/19/2012    ROS:  General: Negative for anorexia, weight loss, fever, chills, fatigue, weakness. ENT: Negative for hoarseness, difficulty swallowing , nasal congestion. CV: Negative for chest pain, angina, palpitations, dyspnea on exertion, peripheral edema.  Respiratory: Negative for dyspnea at rest, dyspnea on exertion, cough, sputum, wheezing.  GI: See history of present illness. GU:  Negative for dysuria, hematuria, urinary incontinence, urinary frequency, nocturnal urination.  Endo: Negative for unusual weight change.    Physical Examination:   BP (!) 151/87   Pulse (!) 58   Temp (!) 97.5 F (36.4 C)   Ht 5\' 11"  (1.803 m)   Wt 208 lb (94.3 kg)   BMI 29.01 kg/m   General: Well-nourished, well-developed in no acute distress.  Eyes: No icterus. Conjunctivae pink. Mouth: Oropharyngeal mucosa moist and pink , no lesions erythema or exudate. Lungs: Clear to auscultation bilaterally. Non-labored. Heart: Regular rate and rhythm, no murmurs rubs or gallops.  Abdomen: Bowel sounds are normal, nontender, nondistended, no hepatosplenomegaly or masses, no abdominal bruits or hernia , no rebound or guarding.   Extremities: No lower extremity edema. No clubbing or deformities. Neuro: Alert and oriented x 3.  Grossly intact. Skin: Warm and dry, no jaundice.   Psych: Alert and cooperative, normal mood and  affect.   Imaging Studies: Ct Shoulder Left Wo Contrast  Result Date: 12/17/2018 CLINICAL DATA:  Preop for left total shoulder arthroplasty. EXAM: CT OF THE UPPER LEFT EXTREMITY WITHOUT CONTRAST TECHNIQUE: Multidetector CT imaging of the upper left extremity was performed according to the standard protocol. COMPARISON:  Left shoulder radiographs 09/03/2018 FINDINGS: Severe left glenohumeral joint degenerative changes with marked joint space narrowing, full-thickness cartilage loss, extensive osteophytic spurring and scattered subchondral cystic  change. Very large elongated flowing osteophyte involving the inferior humeral head and neck. 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 The glenoid is not significantly widened or thinned. Osteophytic spurring noted along the lesser tuberosity and surrounding the long head biceps tendon. The rotator cuff tendons are grossly intact. Moderate AC joint degenerative changes with inferior spurring. The acromion is type 1-2 in shape. The clavicle and ribs are intact. The left lung is clear of an acute process or worrisome pulmonary lesion. IMPRESSION: 1. Severe left glenohumeral joint degenerative changes as detailed above. 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 2. Moderate AC joint degenerative changes. 3. Grossly intact rotator cuff tendons. Electronically Signed   By: Marijo Sanes M.D.   On: 12/17/2018 12:17    Assessment and Plan:   Dalton Delacruz is a 68 y.o. y/o male here to follow-up for upper GI symptoms at his initial visit.  Some concern for dysphagia.  EGD did not show any abnormalities such as a stricture.  Biopsies of the esophagus were completely normal.  He still continues to have the chronic cough particularly in the morning and has not responded at all to omeprazole.  There was no evidence of esophagitis.  He does have some history suggestive of a postnasal drip.  I am going to increase his omeprazole to the maximum dose at 40 mg twice a day.  He will email me in 2 weeks time to let me know whether it has made a difference or not.  If it is not made a difference it is very unlikely that his chronic cough is due to reflux.  I would suggest he proceed with evaluation with ENT to have a laryngoscopy to rule out other causes for chronic cough.  If ENT evaluation is negative may also require evaluation by pulmonary.  I will see him back in 8 to 10 weeks to discuss the discontinuation of the PPI as it has not  really helped him so far  Dr [name]  MD,MRCP [facility]) Follow up in 8 to 10 weeks

## 2019-01-08 DIAGNOSIS — K219 Gastro-esophageal reflux disease without esophagitis: Secondary | ICD-10-CM | POA: Diagnosis not present

## 2019-01-08 DIAGNOSIS — R05 Cough: Secondary | ICD-10-CM | POA: Diagnosis not present

## 2019-01-08 DIAGNOSIS — R682 Dry mouth, unspecified: Secondary | ICD-10-CM | POA: Diagnosis not present

## 2019-01-12 ENCOUNTER — Other Ambulatory Visit: Payer: Self-pay | Admitting: Otolaryngology

## 2019-01-12 DIAGNOSIS — K117 Disturbances of salivary secretion: Secondary | ICD-10-CM

## 2019-01-12 DIAGNOSIS — R1310 Dysphagia, unspecified: Secondary | ICD-10-CM

## 2019-01-12 DIAGNOSIS — R12 Heartburn: Secondary | ICD-10-CM

## 2019-01-14 ENCOUNTER — Telehealth: Payer: Self-pay | Admitting: Family Medicine

## 2019-01-14 NOTE — Telephone Encounter (Signed)
[facility] called today in regards to paperwork they faxed over on the 13th.  This was a medical clearance form, the office would like to check the status of this and see if this has been completed.    Phone- [phone]

## 2019-01-14 NOTE — Telephone Encounter (Signed)
Left detailed message on voicemail of Dalton Delacruz at [facility] and also for the patient to schedule an appointment.

## 2019-01-15 NOTE — Telephone Encounter (Signed)
Appointment scheduled.

## 2019-01-17 ENCOUNTER — Encounter: Payer: Self-pay | Admitting: Gastroenterology

## 2019-01-21 ENCOUNTER — Ambulatory Visit (INDEPENDENT_AMBULATORY_CARE_PROVIDER_SITE_OTHER): Payer: Medicare HMO | Admitting: Family Medicine

## 2019-01-21 ENCOUNTER — Encounter: Payer: Self-pay | Admitting: Family Medicine

## 2019-01-21 ENCOUNTER — Other Ambulatory Visit: Payer: Self-pay

## 2019-01-21 VITALS — BP 140/84 | HR 71 | Temp 97.3°F | Ht 71.0 in | Wt 210.4 lb

## 2019-01-21 DIAGNOSIS — M25512 Pain in left shoulder: Secondary | ICD-10-CM | POA: Diagnosis not present

## 2019-01-21 DIAGNOSIS — Z029 Encounter for administrative examinations, unspecified: Secondary | ICD-10-CM

## 2019-01-21 MED ORDER — XYLIMELTS 550 MG MT DISK: 1.0000 | DISK | Freq: Every evening | OROMUCOSAL | Status: DC

## 2019-01-21 MED ORDER — XYLIMELTS 550 MG MT DISK: 2.0000 | DISK | Freq: Every evening | OROMUCOSAL | Status: DC

## 2019-01-21 MED ORDER — OMEPRAZOLE 40 MG PO CPDR: 40.0000 mg | DELAYED_RELEASE_CAPSULE | Freq: Two times a day (BID) | ORAL | 3 refills | Status: DC

## 2019-01-21 MED ORDER — TAMSULOSIN HCL 0.4 MG PO CAPS: 0.4000 mg | ORAL_CAPSULE | Freq: Every day | ORAL | Status: DC

## 2019-01-21 NOTE — Progress Notes (Signed)
His cough is better with a PM lozenge combined with PPI BID.    He is urinating well with flomax.   Preop eval.  Plan for left shoulder replacement.  No h/o MI, CVA, CHF.  BP controlled off BP meds.  Not diabetic.  No h/o CAD.  EKG normal, discussed with patient at office visit.  He has severe restriction on ROM L shoulder and surgery should improve that.  We talked about the pros and cons of surgery.  There appears to be no advantage to delaying surgery at this point given that his shoulder range of motion would likely only worsen with delaying surgery.  PMH and SH reviewed  ROS: Per HPI unless specifically indicated in ROS section   Meds, vitals, and allergies reviewed.   GEN: nad, alert and oriented HEENT: ncat NECK: supple w/o LA CV: rrr.  PULM: ctab, no inc wob ABD: soft, +bs EXT: no edema SKIN: no acute rash Decreased left shoulder range of motion

## 2019-01-21 NOTE — Patient Instructions (Signed)
It appears that you are appropriately low risk for surgery and I'll update the ortho clinic.  Take care.  Glad to see you.

## 2019-01-22 NOTE — Telephone Encounter (Signed)
Dr Sharlene Motts is working on the paperwork

## 2019-01-22 NOTE — Telephone Encounter (Signed)
Dalton Delacruz with Ortho office called today to check status of clearance form sinc patient thad his appointment yesterday

## 2019-01-25 ENCOUNTER — Ambulatory Visit
Admission: RE | Admit: 2019-01-25 | Discharge: 2019-01-25 | Disposition: A | Discharge status: Home / Self Care | Payer: Medicare HMO | Source: Ambulatory Visit | Attending: Otolaryngology | Admitting: Otolaryngology

## 2019-01-25 ENCOUNTER — Other Ambulatory Visit: Payer: Self-pay

## 2019-01-25 DIAGNOSIS — R12 Heartburn: Secondary | ICD-10-CM | POA: Diagnosis not present

## 2019-01-25 DIAGNOSIS — R1312 Dysphagia, oropharyngeal phase: Secondary | ICD-10-CM | POA: Insufficient documentation

## 2019-01-25 DIAGNOSIS — K117 Disturbances of salivary secretion: Secondary | ICD-10-CM | POA: Insufficient documentation

## 2019-01-25 DIAGNOSIS — R1310 Dysphagia, unspecified: Secondary | ICD-10-CM | POA: Diagnosis not present

## 2019-01-25 NOTE — Therapy (Signed)
[place], [place], [address] Phone: [phone]   Fax:     Modified Barium Swallow  Patient Details  Name: Dalton Delacruz MRN: [id number] Date of Birth: 06-Nov-1950 No data recorded  Encounter Date: 01/25/2019    Past Medical History:  Diagnosis Date  . Displaced spiral fracture of shaft of left humerus ~ 2002  . GERD (gastroesophageal reflux disease)    with LPR    Past Surgical History:  Procedure Laterality Date  . CATARACT EXTRACTION Bilateral    2015  . COLONOSCOPY     2010  . COLONOSCOPY WITH PROPOFOL N/A 12/15/2018   Procedure: COLONOSCOPY WITH PROPOFOL;  Surgeon: [name], MD;  Location: [facility] ENDOSCOPY;  Service: Gastroenterology;  Laterality: N/A;  . ESOPHAGOGASTRODUODENOSCOPY (EGD) WITH PROPOFOL N/A 12/15/2018   Procedure: ESOPHAGOGASTRODUODENOSCOPY (EGD) WITH PROPOFOL;  Surgeon: [name], MD;  Location: [facility] ENDOSCOPY;  Service: Gastroenterology;  Laterality: N/A;  . FISTULOTOMY  03/04/13  . VASECTOMY      There were no vitals filed for this visit.    Subjective: Patient behavior: (alertness, ability to follow instructions, etc.): The patient is alert, able to verbalize his complaints, and follow directions.  Chief complaint: Chronic cough   Objective:  Radiological Procedure: A videoflouroscopic evaluation of oral-preparatory, reflex initiation, and pharyngeal phases of the swallow was performed; as well as a screening of the upper esophageal phase.  I. POSTURE: Upright in MBS chair  II. VIEW: Lateral  III. COMPENSATORY STRATEGIES: N/A  IV. BOLUSES ADMINISTERED:   Thin Liquid: 1 small, 3 rapid consecutive   Nectar-thick Liquid: 1 moderate   Honey-thick Liquid: DNT   Puree: 1 self-administered bolus   Mechanical Soft: 1/4 graham cracker in applesauce  V. RESULTS OF EVALUATION: A. ORAL PREPARATORY PHASE: (The lips, tongue, and velum are observed for  strength and coordination)       **Overall Severity Rating: within normal limits   B. SWALLOW INITIATION/REFLEX: (The reflex is normal if "triggered" by the time the bolus reached the base of the tongue)  **Overall Severity Rating: Mild; triggers while falling from the valleculae to the pyriform sinuses  C. PHARYNGEAL PHASE: (Pharyngeal function is normal if the bolus shows rapid, smooth, and continuous transit through the pharynx and there is no pharyngeal residue after the swallow)  **Overall Severity Rating: Mild; reduced pharyngeal pressure generation with mild pharyngeal residue  D. LARYNGEAL PENETRATION: (Material entering into the laryngeal inlet/vestibule but not aspirated) None  E. ASPIRATION: None  F. ESOPHAGEAL PHASE: (Screening of the upper esophagus) No observed abnormality within the viewable cervical esophagus  ASSESSMENT: This 68 year old man; with dry cough, xerostomia, thickened secretions, and bowed vocal cords; is presenting with mild oropharyngeal dysphagia characterized by delayed pharyngeal swallow initiation, reduced pharyngeal pressure generation, and mild-moderate pharyngeal residue.  Oral control of the bolus including oral hold, rotary mastication, and anterior to posterior transfer is within functional limits.   There is no observed laryngeal penetration or tracheal aspiration.  The source of the cough does not appear to be related to aspiration.  Delayed pharyngeal swallow initiation and reduced pharyngeal pressure generation are consistent with effects of laryngopharyngeal reflux (inflammation, edema, and resultant decreased sensation of the larynx and pharynx).  Some pharyngeal residue can be attributed to xerostomia.  The patient reports some relief since beginning Xylimelts.  PLAN/RECOMMENDATIONS:   A. Diet: Regular   B. Swallowing Precautions: No aspiration concerns   C. Recommended consultation to: follow up with medical team as  recommended   D. Therapy  recommendations: speech therapy is not indicated   E. Results and recommendations were discussed with the patient immediately following the study and the final report routed to the referring MD.   Oropharyngeal dysphagia - Plan: DG SWALLOW FUNC OP MEDICARE SPEECH PATH, DG SWALLOW FUNC OP MEDICARE SPEECH PATH  Xerostomia - Plan: DG SWALLOW FUNC OP MEDICARE SPEECH PATH, DG SWALLOW FUNC OP MEDICARE SPEECH PATH  Burning reflux - Plan: DG SWALLOW FUNC OP MEDICARE SPEECH PATH, DG SWALLOW FUNC OP MEDICARE SPEECH PATH        Problem List Patient Active Problem List   Diagnosis Date Noted  . Dysphagia   . Colon cancer screening   . Polyp of colon   . Left shoulder pain 09/06/2018  . Lower urinary tract symptoms (LUTS) 02/10/2014  . Advance care planning 02/10/2014  . Cough 02/03/2012  . Hypertension 04/21/2011  . Syncope 03/10/2011  . Medicare annual wellness visit, subsequent 06/26/2010  . Family history of diabetes mellitus (DM) 06/18/2010   Dalton Sea, MS/CCC- SLP  Dalton Delacruz 01/25/2019, 1:31 PM  [place], [place], [address] Phone: [phone]   Fax:     Name: Dalton Delacruz MRN: [id number] Date of Birth: [birth year]

## 2019-01-25 NOTE — Assessment & Plan Note (Signed)
We talked about the pros and cons of surgery.  It appears at this point that he is appropriately low risk for surgery and the benefit of the surgery will likely greatly outweigh the risk.  We will send the paperwork over to orthopedics.  I appreciate the help of all involved.  We talked about the fact that I can never completely "clear" the patient for surgery but it does appear completely reasonable for him to go through with surgery at this point.

## 2019-01-26 NOTE — Patient Instructions (Addendum)
DUE TO COVID-19 ONLY ONE VISITOR IS ALLOWED TO COME WITH YOU AND STAY IN THE WAITING ROOM ONLY DURING PRE OP AND PROCEDURE DAY OF SURGERY. THE 1 VISITOR MAY VISIT WITH YOU AFTER SURGERY IN YOUR PRIVATE ROOM DURING VISITING HOURS ONLY!  YOU NEED TO HAVE A COVID 19 TEST ON 02-01-19  @ 3:05 PM, THIS TEST MUST BE DONE BEFORE SURGERY, COME  [address], [address] , [id number].  ([place]) ONCE YOUR COVID TEST IS COMPLETED, PLEASE BEGIN THE QUARANTINE INSTRUCTIONS AS OUTLINED IN YOUR HANDOUT.                Dalton Delacruz  01/26/2019   Your procedure is scheduled on: 02-04-19    Report to [facility] Main  Entrance    Report to Admitting at 7:30 AM     Call this number if you have problems the morning of surgery [phone]    Remember: NO SOLID FOOD AFTER MIDNIGHT THE NIGHT PRIOR TO SURGERY. NOTHING BY MOUTH EXCEPT CLEAR LIQUIDS UNTIL 7:00 AM . PLEASE FINISH ENSURE DRINK PER SURGEON ORDER  WHICH NEEDS TO BE COMPLETED AT 7:00 AM .   CLEAR LIQUID DIET   Foods Allowed                                                                     Foods Excluded  Coffee and tea, regular and decaf                             liquids that you cannot  Plain Jell-O any favor except red or purple                                           see through such as: Fruit ices (not with fruit pulp)                                     milk, soups, orange juice  Iced Popsicles                                    All solid food Carbonated beverages, regular and diet                                    Cranberry, grape and apple juices Sports drinks like Gatorade Lightly seasoned clear broth or consume(fat free) Sugar, honey syrup  Sample Menu Breakfast                                Lunch                                     Supper Cranberry juice  Beef broth                            Chicken broth Jell-O                                     Grape juice                            Apple juice Coffee or tea                        Jell-O                                      Popsicle                                                Coffee or tea                        Coffee or tea  _____________________________________________________________________       Take these medicines the morning of surgery with A SIP OF WATER: Omeprazole (Prilosec)  BRUSH YOUR TEETH MORNING OF SURGERY AND RINSE YOUR MOUTH OUT, NO CHEWING GUM CANDY OR MINTS.                              You may not have any metal on your body including hair pins and              piercings     Do not wear jewelry, cologne, lotions, powders or deodorant              Men may shave face and neck.   Do not bring valuables to the hospital. [place].  Contacts, dentures or bridgework may not be worn into surgery.  Bring an overnight bag if you like        Special Instructions: N/A              Please read over the following fact sheets you were given: _____________________________________________________________________             [facility] - Preparing for Surgery Before surgery, you can play an important role.  Because skin is not sterile, your skin needs to be as free of germs as possible.  You can reduce the number of germs on your skin by washing with CHG (chlorahexidine gluconate) soap before surgery.  CHG is an antiseptic cleaner which kills germs and bonds with the skin to continue killing germs even after washing. Please DO NOT use if you have an allergy to CHG or antibacterial soaps.  If your skin becomes reddened/irritated stop using the CHG and inform your nurse when you arrive at Short Stay. Do not shave (including legs and underarms) for at least 48 hours prior to the first CHG shower.  You may shave your face/neck. Please follow these instructions carefully:  1.  Shower with CHG Soap the night before surgery and the  morning of  Surgery.  2.  If you choose to wash your hair, wash your hair first as usual with your  normal  shampoo.  3.  After you shampoo, rinse your hair and body thoroughly to remove the  shampoo.                           4.  Use CHG as you would any other liquid soap.  You can apply chg directly  to the skin and wash                       Gently with a scrungie or clean washcloth.  5.  Apply the CHG Soap to your body ONLY FROM THE NECK DOWN.   Do not use on face/ open                           Wound or open sores. Avoid contact with eyes, ears mouth and genitals (private parts).                       Wash face,  Genitals (private parts) with your normal soap.             6.  Wash thoroughly, paying special attention to the area where your surgery  will be performed.  7.  Thoroughly rinse your body with warm water from the neck down.  8.  DO NOT shower/wash with your normal soap after using and rinsing off  the CHG Soap.                9.  Pat yourself dry with a clean towel.            10.  Wear clean pajamas.            11.  Place clean sheets on your bed the night of your first shower and do not  sleep with pets. Day of Surgery : Do not apply any lotions/deodorants the morning of surgery.  Please wear clean clothes to the hospital/surgery center.  FAILURE TO FOLLOW THESE INSTRUCTIONS MAY RESULT IN THE CANCELLATION OF YOUR SURGERY PATIENT SIGNATURE_________________________________  NURSE SIGNATURE__________________________________  ________________________________________________________________________   Dalton Delacruz  An incentive spirometer is a tool that can help keep your lungs clear and active. This tool measures how well you are filling your lungs with each breath. Taking long deep breaths may help reverse or decrease the chance of developing breathing (pulmonary) problems (especially infection) following:  A long period of time when you are unable to move or be active. BEFORE  THE PROCEDURE   If the spirometer includes an indicator to show your best effort, your nurse or respiratory therapist will set it to a desired goal.  If possible, sit up straight or lean slightly forward. Try not to slouch.  Hold the incentive spirometer in an upright position. INSTRUCTIONS FOR USE  1. Sit on the edge of your bed if possible, or sit up as far as you can in bed or on a chair. 2. Hold the incentive spirometer in an upright position. 3. Breathe out normally. 4. Place the mouthpiece in your mouth and seal your lips tightly around it. 5. Breathe in slowly and as deeply as possible, raising the piston or the ball toward the top of the column. 6.  Hold your breath for 3-5 seconds or for as long as possible. Allow the piston or ball to fall to the bottom of the column. 7. Remove the mouthpiece from your mouth and breathe out normally. 8. Rest for a few seconds and repeat Steps 1 through 7 at least 10 times every 1-2 hours when you are awake. Take your time and take a few normal breaths between deep breaths. 9. The spirometer may include an indicator to show your best effort. Use the indicator as a goal to work toward during each repetition. 10. After each set of 10 deep breaths, practice coughing to be sure your lungs are clear. If you have an incision (the cut made at the time of surgery), support your incision when coughing by placing a pillow or rolled up towels firmly against it. Once you are able to get out of bed, walk around indoors and cough well. You may stop using the incentive spirometer when instructed by your caregiver.  RISKS AND COMPLICATIONS  Take your time so you do not get dizzy or light-headed.  If you are in pain, you may need to take or ask for pain medication before doing incentive spirometry. It is harder to take a deep breath if you are having pain. AFTER USE  Rest and breathe slowly and easily.  It can be helpful to keep track of a log of your progress.  Your caregiver can provide you with a simple table to help with this. If you are using the spirometer at home, follow these instructions: [place] IF:   You are having difficultly using the spirometer.  You have trouble using the spirometer as often as instructed.  Your pain medication is not giving enough relief while using the spirometer.  You develop fever of 100.5 F (38.1 C) or higher. SEEK IMMEDIATE MEDICAL CARE IF:   You cough up bloody sputum that had not been present before.  You develop fever of 102 F (38.9 C) or greater.  You develop worsening pain at or near the incision site. MAKE SURE YOU:   Understand these instructions.  Will watch your condition.  Will get help right away if you are not doing well or get worse. Document Released: 07/22/2006 Document Revised: 06/03/2011 Document Reviewed: 09/22/2006 Exit[employer] Patient Information 2014 [employer], [place].   ________________________________________________________________________  WHAT IS A BLOOD TRANSFUSION? Blood Transfusion Information  A transfusion is the replacement of blood or some of its parts. Blood is made up of multiple cells which provide different functions.  Red blood cells carry oxygen and are used for blood loss replacement.  White blood cells fight against infection.  Platelets control bleeding.  Plasma helps clot blood.  Other blood products are available for specialized needs, such as hemophilia or other clotting disorders. BEFORE THE TRANSFUSION  Who gives blood for transfusions?   Healthy volunteers who are fully evaluated to make sure their blood is safe. This is blood bank blood. Transfusion therapy is the safest it has ever been in the practice of medicine. Before blood is taken from a donor, a complete history is taken to make sure that person has no history of diseases nor engages in risky social behavior (examples are intravenous drug use or sexual activity with multiple  partners). The donor's travel history is screened to minimize risk of transmitting infections, such as malaria. The donated blood is tested for signs of infectious diseases, such as HIV and hepatitis. The blood is then tested to be sure it is  compatible with you in order to minimize the chance of a transfusion reaction. If you or a relative donates blood, this is often done in anticipation of surgery and is not appropriate for emergency situations. It takes many days to process the donated blood. RISKS AND COMPLICATIONS Although transfusion therapy is very safe and saves many lives, the main dangers of transfusion include:   Getting an infectious disease.  Developing a transfusion reaction. This is an allergic reaction to something in the blood you were given. Every precaution is taken to prevent this. The decision to have a blood transfusion has been considered carefully by your caregiver before blood is given. Blood is not given unless the benefits outweigh the risks. AFTER THE TRANSFUSION  Right after receiving a blood transfusion, you will usually feel much better and more energetic. This is especially true if your red blood cells have gotten low (anemic). The transfusion raises the level of the red blood cells which carry oxygen, and this usually causes an energy increase.  The nurse administering the transfusion will monitor you carefully for complications. HOME CARE INSTRUCTIONS  No special instructions are needed after a transfusion. You may find your energy is better. Speak with your caregiver about any limitations on activity for underlying diseases you may have. SEEK MEDICAL CARE IF:   Your condition is not improving after your transfusion.  You develop redness or irritation at the intravenous (IV) site. SEEK IMMEDIATE MEDICAL CARE IF:  Any of the following symptoms occur over the next 12 hours:  Shaking chills.  You have a temperature by mouth above 102 F (38.9 C), not  controlled by medicine.  Chest, back, or muscle pain.  People around you feel you are not acting correctly or are confused.  Shortness of breath or difficulty breathing.  Dizziness and fainting.  You get a rash or develop hives.  You have a decrease in urine output.  Your urine turns a dark color or changes to pink, red, or brown. Any of the following symptoms occur over the next 10 days:  You have a temperature by mouth above 102 F (38.9 C), not controlled by medicine.  Shortness of breath.  Weakness after normal activity.  The white part of the eye turns yellow (jaundice).  You have a decrease in the amount of urine or are urinating less often.  Your urine turns a dark color or changes to pink, red, or brown. Document Released: 03/08/2000 Document Revised: 06/03/2011 Document Reviewed: 10/26/2007 [facility] Patient Information 2014 [place], [place].  _______________________________________________________________________

## 2019-01-26 NOTE — Progress Notes (Signed)
PCP - Elsie Stain w/surgical clearance on 01-21-19 and 01-25-19 in Epic note  Cardiologist -   Chest x-ray - 09-03-18   EKG - 01-21-19   Stress Test -  ECHO -  Cardiac Cath -   Sleep Study -  CPAP -   Fasting Blood Sugar -  Checks Blood Sugar _____ times a day  Blood Thinner Instructions: 81mg  ASA.  Aspirin Instructions: Last Dose:02-01-19   Anesthesia review:   Patient denies shortness of breath, fever, cough and chest pain at PAT appointment   Patient verbalized understanding of instructions that were given to them at the PAT appointment. Patient was also instructed that they will need to review over the PAT instructions again at home before surgery.

## 2019-01-26 NOTE — Telephone Encounter (Signed)
PPW faxed to [facility]  [phone]

## 2019-01-27 NOTE — Telephone Encounter (Signed)
Dalton Delacruz is calling back about PPW. She stated that she still has not received this.   I verified fax number that we were faxing this information to and she stated that was correct,

## 2019-01-28 ENCOUNTER — Ambulatory Visit (HOSPITAL_COMMUNITY)
Admission: RE | Admit: 2019-01-28 | Discharge: 2019-01-28 | Disposition: A | Discharge status: Home / Self Care | Payer: Medicare HMO | Source: Ambulatory Visit | Attending: Orthopedic Surgery | Admitting: Orthopedic Surgery

## 2019-01-28 ENCOUNTER — Encounter (HOSPITAL_COMMUNITY): Payer: Self-pay

## 2019-01-28 ENCOUNTER — Encounter (HOSPITAL_COMMUNITY)
Admission: RE | Admit: 2019-01-28 | Discharge: 2019-01-28 | Disposition: A | Discharge status: Home / Self Care | Payer: Medicare HMO | Source: Ambulatory Visit | Attending: Orthopedic Surgery | Admitting: Orthopedic Surgery

## 2019-01-28 ENCOUNTER — Other Ambulatory Visit: Payer: Self-pay

## 2019-01-28 DIAGNOSIS — Z7982 Long term (current) use of aspirin: Secondary | ICD-10-CM | POA: Diagnosis not present

## 2019-01-28 DIAGNOSIS — Z01818 Encounter for other preprocedural examination: Secondary | ICD-10-CM | POA: Diagnosis not present

## 2019-01-28 DIAGNOSIS — K219 Gastro-esophageal reflux disease without esophagitis: Secondary | ICD-10-CM | POA: Insufficient documentation

## 2019-01-28 DIAGNOSIS — Z01811 Encounter for preprocedural respiratory examination: Secondary | ICD-10-CM

## 2019-01-28 DIAGNOSIS — Z79899 Other long term (current) drug therapy: Secondary | ICD-10-CM | POA: Insufficient documentation

## 2019-01-28 DIAGNOSIS — Z96612 Presence of left artificial shoulder joint: Secondary | ICD-10-CM | POA: Diagnosis not present

## 2019-01-28 LAB — COMPREHENSIVE METABOLIC PANEL
ALT: 30 U/L (ref 0–44)
AST: 23 U/L (ref 15–41)
Albumin: 4.3 g/dL (ref 3.5–5.0)
Alkaline Phosphatase: 67 U/L (ref 38–126)
Anion gap: 4 — ABNORMAL LOW (ref 5–15)
BUN: 25 mg/dL — ABNORMAL HIGH (ref 8–23)
CO2: 28 mmol/L (ref 22–32)
Calcium: 8.9 mg/dL (ref 8.9–10.3)
Chloride: 105 mmol/L (ref 98–111)
Creatinine, Ser: 0.97 mg/dL (ref 0.61–1.24)
GFR calc Af Amer: >60 mL/min (ref >60)
GFR calc non Af Amer: >60 mL/min (ref >60)
Glucose, Bld: 103 mg/dL — ABNORMAL HIGH (ref 70–99)
Potassium: 4.5 mmol/L (ref 3.5–5.1)
Sodium: 137 mmol/L (ref 135–145)
Total Bilirubin: 0.8 mg/dL (ref 0.3–1.2)
Total Protein: 7.2 g/dL (ref 6.5–8.1)

## 2019-01-28 LAB — CBC WITH DIFFERENTIAL/PLATELET
Abs Immature Granulocytes: 0.04 10*3/uL (ref 0.00–0.07)
Basophils Absolute: 0.1 10*3/uL (ref 0.0–0.1)
Basophils Relative: 1 %
Eosinophils Absolute: 0.1 10*3/uL (ref 0.0–0.5)
Eosinophils Relative: 2 %
HCT: 45.4 % (ref 39.0–52.0)
Hemoglobin: 14.7 g/dL (ref 13.0–17.0)
Immature Granulocytes: 1 %
Lymphocytes Relative: 18 %
Lymphs Abs: 1.1 10*3/uL (ref 0.7–4.0)
MCH: 31.1 pg (ref 26.0–34.0)
MCHC: 32.4 g/dL (ref 30.0–36.0)
MCV: 96 fL (ref 80.0–100.0)
Monocytes Absolute: 0.7 10*3/uL (ref 0.1–1.0)
Monocytes Relative: 11 %
Neutro Abs: 4.2 10*3/uL (ref 1.7–7.7)
Neutrophils Relative %: 67 %
Platelets: 271 10*3/uL (ref 150–400)
RBC: 4.73 MIL/uL (ref 4.22–5.81)
RDW: 12.1 % (ref 11.5–15.5)
WBC: 6.2 10*3/uL (ref 4.0–10.5)
nRBC: 0 % (ref 0.0–0.2)

## 2019-01-28 LAB — URINALYSIS, ROUTINE W REFLEX MICROSCOPIC
Bilirubin Urine: NEGATIVE
Glucose, UA: NEGATIVE mg/dL
Hgb urine dipstick: NEGATIVE
Ketones, ur: NEGATIVE mg/dL
Leukocytes,Ua: NEGATIVE
Nitrite: NEGATIVE
Protein, ur: NEGATIVE mg/dL
Specific Gravity, Urine: 1.017 (ref 1.005–1.030)
pH: 5 (ref 5.0–8.0)

## 2019-01-28 LAB — PROTIME-INR
INR: 1 (ref 0.8–1.2)
Prothrombin Time: 13.4 seconds (ref 11.4–15.2)

## 2019-01-28 LAB — ABO/RH: ABO/RH(D): O POS

## 2019-01-28 LAB — SURGICAL PCR SCREEN
MRSA, PCR: NEGATIVE
Staphylococcus aureus: NEGATIVE

## 2019-01-28 LAB — APTT: aPTT: 34 seconds (ref 24–36)

## 2019-02-01 ENCOUNTER — Other Ambulatory Visit (HOSPITAL_COMMUNITY)
Admission: RE | Admit: 2019-02-01 | Discharge: 2019-02-01 | Disposition: A | Discharge status: Home / Self Care | Payer: Medicare HMO | Source: Ambulatory Visit | Attending: Orthopedic Surgery | Admitting: Orthopedic Surgery

## 2019-02-02 LAB — NOVEL CORONAVIRUS, NAA (HOSP ORDER, SEND-OUT TO REF LAB; TAT 18-24 HRS): SARS-CoV-2, NAA: NOT DETECTED

## 2019-02-04 ENCOUNTER — Encounter (HOSPITAL_COMMUNITY): Payer: Self-pay | Admitting: Emergency Medicine

## 2019-02-04 ENCOUNTER — Inpatient Hospital Stay (HOSPITAL_COMMUNITY): Payer: Medicare HMO | Admitting: Anesthesiology

## 2019-02-04 ENCOUNTER — Other Ambulatory Visit: Payer: Self-pay

## 2019-02-04 ENCOUNTER — Inpatient Hospital Stay (HOSPITAL_COMMUNITY)
Admission: RE | Admit: 2019-02-04 | Discharge: 2019-02-05 | DRG: 483 | Disposition: A | Discharge status: Home / Self Care | Payer: Medicare HMO | Source: Ambulatory Visit | Attending: Orthopedic Surgery | Admitting: Orthopedic Surgery

## 2019-02-04 ENCOUNTER — Inpatient Hospital Stay (HOSPITAL_COMMUNITY): Payer: Medicare HMO

## 2019-02-04 ENCOUNTER — Encounter (HOSPITAL_COMMUNITY)
Admission: RE | Disposition: A | Discharge status: Home / Self Care | Payer: Self-pay | Source: Home / Self Care | Attending: Orthopedic Surgery

## 2019-02-04 DIAGNOSIS — X58XXXA Exposure to other specified factors, initial encounter: Secondary | ICD-10-CM | POA: Diagnosis not present

## 2019-02-04 DIAGNOSIS — G8918 Other acute postprocedural pain: Secondary | ICD-10-CM | POA: Diagnosis not present

## 2019-02-04 DIAGNOSIS — S42342A Displaced spiral fracture of shaft of humerus, left arm, initial encounter for closed fracture: Secondary | ICD-10-CM | POA: Diagnosis not present

## 2019-02-04 DIAGNOSIS — K219 Gastro-esophageal reflux disease without esophagitis: Secondary | ICD-10-CM | POA: Diagnosis present

## 2019-02-04 DIAGNOSIS — M19012 Primary osteoarthritis, left shoulder: Principal | ICD-10-CM | POA: Diagnosis present

## 2019-02-04 DIAGNOSIS — Z01812 Encounter for preprocedural laboratory examination: Secondary | ICD-10-CM

## 2019-02-04 DIAGNOSIS — R55 Syncope and collapse: Secondary | ICD-10-CM | POA: Diagnosis not present

## 2019-02-04 DIAGNOSIS — Z7982 Long term (current) use of aspirin: Secondary | ICD-10-CM | POA: Diagnosis not present

## 2019-02-04 DIAGNOSIS — Z79899 Other long term (current) drug therapy: Secondary | ICD-10-CM | POA: Diagnosis not present

## 2019-02-04 DIAGNOSIS — Z87891 Personal history of nicotine dependence: Secondary | ICD-10-CM

## 2019-02-04 DIAGNOSIS — Z8249 Family history of ischemic heart disease and other diseases of the circulatory system: Secondary | ICD-10-CM | POA: Diagnosis not present

## 2019-02-04 DIAGNOSIS — Z96612 Presence of left artificial shoulder joint: Secondary | ICD-10-CM | POA: Diagnosis not present

## 2019-02-04 DIAGNOSIS — Z20828 Contact with and (suspected) exposure to other viral communicable diseases: Secondary | ICD-10-CM | POA: Diagnosis present

## 2019-02-04 DIAGNOSIS — Z888 Allergy status to other drugs, medicaments and biological substances status: Secondary | ICD-10-CM | POA: Diagnosis not present

## 2019-02-04 DIAGNOSIS — I10 Essential (primary) hypertension: Secondary | ICD-10-CM | POA: Diagnosis not present

## 2019-02-04 DIAGNOSIS — Z471 Aftercare following joint replacement surgery: Secondary | ICD-10-CM | POA: Diagnosis not present

## 2019-02-04 HISTORY — PX: TOTAL SHOULDER ARTHROPLASTY: SHX126

## 2019-02-04 LAB — TYPE AND SCREEN
ABO/RH(D): O POS
Antibody Screen: NEGATIVE

## 2019-02-04 SURGERY — ARTHROPLASTY, SHOULDER, TOTAL
Anesthesia: General | Site: Shoulder | Laterality: Left

## 2019-02-04 MED ORDER — HYDROMORPHONE HCL 1 MG/ML IJ SOLN: 0.5000 mg | INTRAMUSCULAR | Status: DC | PRN

## 2019-02-04 MED ORDER — PHENYLEPHRINE 40 MCG/ML (10ML) SYRINGE FOR IV PUSH (FOR BLOOD PRESSURE SUPPORT)
PREFILLED_SYRINGE | INTRAVENOUS | Status: AC
  Filled 2019-02-04: qty 10

## 2019-02-04 MED ORDER — DOCUSATE SODIUM 100 MG PO CAPS
100.0000 mg | ORAL_CAPSULE | Freq: Two times a day (BID) | ORAL | Status: DC
  Administered 2019-02-04 – 2019-02-05 (×3): 100 mg via ORAL
  Filled 2019-02-04 (×3): qty 1

## 2019-02-04 MED ORDER — METOCLOPRAMIDE HCL 5 MG/ML IJ SOLN: 5.0000 mg | Freq: Three times a day (TID) | INTRAMUSCULAR | Status: DC | PRN

## 2019-02-04 MED ORDER — DEXAMETHASONE SODIUM PHOSPHATE 10 MG/ML IJ SOLN
INTRAMUSCULAR | Status: DC | PRN
  Administered 2019-02-04: 10 mg via INTRAVENOUS

## 2019-02-04 MED ORDER — ONDANSETRON HCL 4 MG/2ML IJ SOLN: 4.0000 mg | Freq: Four times a day (QID) | INTRAMUSCULAR | Status: DC | PRN

## 2019-02-04 MED ORDER — DEXAMETHASONE SODIUM PHOSPHATE 10 MG/ML IJ SOLN
INTRAMUSCULAR | Status: AC
  Filled 2019-02-04: qty 1

## 2019-02-04 MED ORDER — LIDOCAINE 2% (20 MG/ML) 5 ML SYRINGE
INTRAMUSCULAR | Status: DC | PRN
  Administered 2019-02-04: 80 mg via INTRAVENOUS

## 2019-02-04 MED ORDER — OXYCODONE HCL 5 MG/5ML PO SOLN: 5.0000 mg | Freq: Once | ORAL | Status: DC | PRN

## 2019-02-04 MED ORDER — CEFAZOLIN SODIUM-DEXTROSE 2-4 GM/100ML-% IV SOLN
2.0000 g | Freq: Four times a day (QID) | INTRAVENOUS | Status: AC
  Administered 2019-02-04 – 2019-02-05 (×3): 2 g via INTRAVENOUS
  Filled 2019-02-04 (×3): qty 100

## 2019-02-04 MED ORDER — FENTANYL CITRATE (PF) 100 MCG/2ML IJ SOLN
50.0000 ug | INTRAMUSCULAR | Status: DC
  Administered 2019-02-04: 100 ug via INTRAVENOUS
  Filled 2019-02-04: qty 2

## 2019-02-04 MED ORDER — PHENOL 1.4 % MT LIQD: 1.0000 | OROMUCOSAL | Status: DC | PRN

## 2019-02-04 MED ORDER — ASPIRIN EC 81 MG PO TBEC
81.0000 mg | DELAYED_RELEASE_TABLET | Freq: Two times a day (BID) | ORAL | Status: DC
  Administered 2019-02-04 – 2019-02-05 (×2): 81 mg via ORAL
  Filled 2019-02-04 (×2): qty 1

## 2019-02-04 MED ORDER — ONDANSETRON HCL 4 MG/2ML IJ SOLN
INTRAMUSCULAR | Status: DC | PRN
  Administered 2019-02-04: 4 mg via INTRAVENOUS

## 2019-02-04 MED ORDER — LIDOCAINE 2% (20 MG/ML) 5 ML SYRINGE
INTRAMUSCULAR | Status: AC
  Filled 2019-02-04: qty 5

## 2019-02-04 MED ORDER — ROCURONIUM BROMIDE 50 MG/5ML IV SOSY
PREFILLED_SYRINGE | INTRAVENOUS | Status: DC | PRN
  Administered 2019-02-04: 50 mg via INTRAVENOUS

## 2019-02-04 MED ORDER — PROPOFOL 10 MG/ML IV BOLUS
INTRAVENOUS | Status: AC
  Filled 2019-02-04: qty 20

## 2019-02-04 MED ORDER — FENTANYL CITRATE (PF) 100 MCG/2ML IJ SOLN: 25.0000 ug | INTRAMUSCULAR | Status: DC | PRN

## 2019-02-04 MED ORDER — FLEET ENEMA 7-19 GM/118ML RE ENEM: 1.0000 | ENEMA | Freq: Once | RECTAL | Status: DC | PRN

## 2019-02-04 MED ORDER — ROCURONIUM BROMIDE 10 MG/ML (PF) SYRINGE
PREFILLED_SYRINGE | INTRAVENOUS | Status: AC
  Filled 2019-02-04: qty 10

## 2019-02-04 MED ORDER — PHENYLEPHRINE HCL-NACL 10-0.9 MG/250ML-% IV SOLN
INTRAVENOUS | Status: DC | PRN
  Administered 2019-02-04: 20 ug/min via INTRAVENOUS

## 2019-02-04 MED ORDER — SODIUM CHLORIDE 0.9 % IR SOLN
Status: DC | PRN
  Administered 2019-02-04 (×2): 1000 mL

## 2019-02-04 MED ORDER — METOCLOPRAMIDE HCL 5 MG PO TABS: 5.0000 mg | ORAL_TABLET | Freq: Three times a day (TID) | ORAL | Status: DC | PRN

## 2019-02-04 MED ORDER — CEFAZOLIN SODIUM-DEXTROSE 2-4 GM/100ML-% IV SOLN
2.0000 g | INTRAVENOUS | Status: AC
  Administered 2019-02-04: 2 g via INTRAVENOUS
  Filled 2019-02-04: qty 100

## 2019-02-04 MED ORDER — ONDANSETRON HCL 4 MG PO TABS: 4.0000 mg | ORAL_TABLET | Freq: Four times a day (QID) | ORAL | Status: DC | PRN

## 2019-02-04 MED ORDER — DIPHENHYDRAMINE HCL 12.5 MG/5ML PO ELIX: 12.5000 mg | ORAL_SOLUTION | ORAL | Status: DC | PRN

## 2019-02-04 MED ORDER — SODIUM CHLORIDE 0.9 % IV SOLN
INTRAVENOUS | Status: DC
  Administered 2019-02-04: 16:00:00 via INTRAVENOUS

## 2019-02-04 MED ORDER — ACETAMINOPHEN 325 MG PO TABS: 325.0000 mg | ORAL_TABLET | ORAL | Status: DC | PRN

## 2019-02-04 MED ORDER — EPHEDRINE SULFATE-NACL 50-0.9 MG/10ML-% IV SOSY
PREFILLED_SYRINGE | INTRAVENOUS | Status: DC | PRN
  Administered 2019-02-04 (×2): 5 mg via INTRAVENOUS
  Administered 2019-02-04: 15 mg via INTRAVENOUS

## 2019-02-04 MED ORDER — ONDANSETRON HCL 4 MG/2ML IJ SOLN
INTRAMUSCULAR | Status: AC
  Filled 2019-02-04: qty 2

## 2019-02-04 MED ORDER — OXYCODONE HCL 5 MG PO TABS
5.0000 mg | ORAL_TABLET | ORAL | Status: DC | PRN
  Administered 2019-02-05: 10 mg via ORAL
  Administered 2019-02-05: 5 mg via ORAL
  Filled 2019-02-04: qty 1
  Filled 2019-02-04: qty 2

## 2019-02-04 MED ORDER — OXYCODONE HCL 5 MG PO TABS: 5.0000 mg | ORAL_TABLET | Freq: Once | ORAL | Status: DC | PRN

## 2019-02-04 MED ORDER — POLYETHYLENE GLYCOL 3350 17 G PO PACK: 17.0000 g | PACK | Freq: Every day | ORAL | Status: DC | PRN

## 2019-02-04 MED ORDER — MENTHOL 3 MG MT LOZG: 1.0000 | LOZENGE | OROMUCOSAL | Status: DC | PRN

## 2019-02-04 MED ORDER — ACETAMINOPHEN 500 MG PO TABS
1000.0000 mg | ORAL_TABLET | Freq: Four times a day (QID) | ORAL | Status: DC
  Administered 2019-02-04 – 2019-02-05 (×3): 1000 mg via ORAL
  Filled 2019-02-04 (×3): qty 2

## 2019-02-04 MED ORDER — ZOLPIDEM TARTRATE 5 MG PO TABS: 5.0000 mg | ORAL_TABLET | Freq: Every evening | ORAL | Status: DC | PRN

## 2019-02-04 MED ORDER — MIDAZOLAM HCL 2 MG/2ML IJ SOLN
1.0000 mg | INTRAMUSCULAR | Status: DC
  Administered 2019-02-04: 2 mg via INTRAVENOUS
  Filled 2019-02-04: qty 2

## 2019-02-04 MED ORDER — ONDANSETRON HCL 4 MG/2ML IJ SOLN: 4.0000 mg | Freq: Once | INTRAMUSCULAR | Status: DC | PRN

## 2019-02-04 MED ORDER — BUPIVACAINE LIPOSOME 1.3 % IJ SUSP
INTRAMUSCULAR | Status: DC | PRN
  Administered 2019-02-04: 10 mL via PERINEURAL

## 2019-02-04 MED ORDER — BUPIVACAINE HCL 0.5 % IJ SOLN
INTRAMUSCULAR | Status: DC | PRN
  Administered 2019-02-04: 25 mL

## 2019-02-04 MED ORDER — PROPOFOL 10 MG/ML IV BOLUS
INTRAVENOUS | Status: DC | PRN
  Administered 2019-02-04: 150 mg via INTRAVENOUS
  Administered 2019-02-04: 50 mg via INTRAVENOUS

## 2019-02-04 MED ORDER — BISACODYL 5 MG PO TBEC: 5.0000 mg | DELAYED_RELEASE_TABLET | Freq: Every day | ORAL | Status: DC | PRN

## 2019-02-04 MED ORDER — MEPERIDINE HCL 50 MG/ML IJ SOLN: 6.2500 mg | INTRAMUSCULAR | Status: DC | PRN

## 2019-02-04 MED ORDER — LACTATED RINGERS IV SOLN
INTRAVENOUS | Status: DC
  Administered 2019-02-04 (×2): via INTRAVENOUS

## 2019-02-04 MED ORDER — TRANEXAMIC ACID-NACL 1000-0.7 MG/100ML-% IV SOLN
1000.0000 mg | INTRAVENOUS | Status: AC
  Administered 2019-02-04: 11:00:00 1000 mg via INTRAVENOUS
  Filled 2019-02-04: qty 100

## 2019-02-04 MED ORDER — ACETAMINOPHEN 160 MG/5ML PO SOLN: 325.0000 mg | ORAL | Status: DC | PRN

## 2019-02-04 MED ORDER — ALUM & MAG HYDROXIDE-SIMETH 200-200-20 MG/5ML PO SUSP: 30.0000 mL | ORAL | Status: DC | PRN

## 2019-02-04 MED ORDER — OXYCODONE HCL 5 MG PO TABS: 10.0000 mg | ORAL_TABLET | ORAL | Status: DC | PRN

## 2019-02-04 MED ORDER — CHLORHEXIDINE GLUCONATE 4 % EX LIQD: 60.0000 mL | Freq: Once | CUTANEOUS | Status: DC

## 2019-02-04 MED ORDER — ACETAMINOPHEN 325 MG PO TABS: 325.0000 mg | ORAL_TABLET | Freq: Four times a day (QID) | ORAL | Status: DC | PRN

## 2019-02-04 MED ORDER — PANTOPRAZOLE SODIUM 40 MG PO TBEC
40.0000 mg | DELAYED_RELEASE_TABLET | Freq: Every day | ORAL | Status: DC
  Administered 2019-02-05: 40 mg via ORAL
  Filled 2019-02-04: qty 1

## 2019-02-04 MED ORDER — SUGAMMADEX SODIUM 200 MG/2ML IV SOLN
INTRAVENOUS | Status: DC | PRN
  Administered 2019-02-04: 200 mg via INTRAVENOUS

## 2019-02-04 MED ORDER — PHENYLEPHRINE HCL (PRESSORS) 10 MG/ML IV SOLN
INTRAVENOUS | Status: AC
  Filled 2019-02-04: qty 1

## 2019-02-04 SURGICAL SUPPLY — 65 items
BAG ZIPLOCK 12X15 (MISCELLANEOUS) ×2 IMPLANT
BIT DRILL 1.6MX128 (BIT) ×2 IMPLANT
BIT DRILL 2.4X128 (BIT) IMPLANT
BLADE SAW SAG 73X25 THK (BLADE) ×1
BLADE SAW SGTL 18X1.27X75 (BLADE) IMPLANT
BLADE SAW SGTL 73X25 THK (BLADE) ×1 IMPLANT
CEMENT BONE DEPUY (Cement) ×2 IMPLANT
CHLORAPREP W/TINT 26 (MISCELLANEOUS) ×4 IMPLANT
COMP GLENOID 50 LRG SHLDR (Miscellaneous) ×2 IMPLANT
COMPONENT GLENOID 50 LRG SHLDR (Miscellaneous) ×1 IMPLANT
COOLER ICEMAN CLASSIC (MISCELLANEOUS) ×2 IMPLANT
COVER BACK TABLE 60X90IN (DRAPES) ×2 IMPLANT
COVER SURGICAL LIGHT HANDLE (MISCELLANEOUS) ×2 IMPLANT
COVER WAND RF STERILE (DRAPES) IMPLANT
DRAPE INCISE IOBAN 66X45 STRL (DRAPES) ×2 IMPLANT
DRAPE ORTHO SPLIT 77X108 STRL (DRAPES) ×2
DRAPE POUCH INSTRU U-SHP 10X18 (DRAPES) ×2 IMPLANT
DRAPE SURG 17X11 SM STRL (DRAPES) ×2 IMPLANT
DRAPE SURG ORHT 6 SPLT 77X108 (DRAPES) ×2 IMPLANT
DRAPE U-SHAPE 47X51 STRL (DRAPES) ×2 IMPLANT
DRSG AQUACEL AG ADV 3.5X 6 (GAUZE/BANDAGES/DRESSINGS) ×2 IMPLANT
ELECT BLADE TIP CTD 4 INCH (ELECTRODE) ×2 IMPLANT
ELECT REM PT RETURN 15FT ADLT (MISCELLANEOUS) ×2 IMPLANT
GLOVE BIO SURGEON STRL SZ7 (GLOVE) ×2 IMPLANT
GLOVE BIO SURGEON STRL SZ7.5 (GLOVE) ×2 IMPLANT
GLOVE BIOGEL PI IND STRL 7.0 (GLOVE) ×1 IMPLANT
GLOVE BIOGEL PI IND STRL 8 (GLOVE) ×1 IMPLANT
GLOVE BIOGEL PI INDICATOR 7.0 (GLOVE) ×1
GLOVE BIOGEL PI INDICATOR 8 (GLOVE) ×1
GOWN STRL REUS W/TWL LRG LVL3 (GOWN DISPOSABLE) ×2 IMPLANT
GOWN STRL REUS W/TWL XL LVL3 (GOWN DISPOSABLE) ×2 IMPLANT
GUIDEWIRE GLENOID 2.5X220 (WIRE) ×2 IMPLANT
HANDPIECE INTERPULSE COAX TIP (DISPOSABLE) ×1
HEAD HUM AEQUALIS 52X19 (Head) ×2 IMPLANT
HEMOSTAT SURGICEL 4X8 (HEMOSTASIS) ×2 IMPLANT
HOOD PEEL AWAY FLYTE STAYCOOL (MISCELLANEOUS) ×6 IMPLANT
KIT BASIN OR (CUSTOM PROCEDURE TRAY) ×2 IMPLANT
KIT TURNOVER KIT A (KITS) IMPLANT
MANIFOLD NEPTUNE II (INSTRUMENTS) ×2 IMPLANT
NEEDLE MA TROC 1/2 CIR (NEEDLE) ×2 IMPLANT
NS IRRIG 1000ML POUR BTL (IV SOLUTION) ×2 IMPLANT
PACK SHOULDER (CUSTOM PROCEDURE TRAY) ×2 IMPLANT
PAD COLD SHLDR WRAP-ON (PAD) ×2 IMPLANT
PROTECTOR NERVE ULNAR (MISCELLANEOUS) ×2 IMPLANT
RESTRAINT HEAD UNIVERSAL NS (MISCELLANEOUS) ×2 IMPLANT
RETRIEVER SUT HEWSON (MISCELLANEOUS) ×2 IMPLANT
SET HNDPC FAN SPRY TIP SCT (DISPOSABLE) ×1 IMPLANT
SLING ARM IMMOBILIZER LRG (SOFTGOODS) ×2 IMPLANT
SMARTMIX MINI TOWER (MISCELLANEOUS) ×2
SPONGE LAP 18X18 RF (DISPOSABLE) ×2 IMPLANT
STEM HUMERAL FLEX 5C 137.5D (Stem) ×2 IMPLANT
STRIP CLOSURE SKIN 1/2X4 (GAUZE/BANDAGES/DRESSINGS) ×2 IMPLANT
SUCTION FRAZIER HANDLE 12FR (TUBING) ×1
SUCTION TUBE FRAZIER 12FR DISP (TUBING) ×1 IMPLANT
SUPPORT WRAP ARM LG (MISCELLANEOUS) ×2 IMPLANT
SUT ETHIBOND 2 V 37 (SUTURE) ×2 IMPLANT
SUT MNCRL AB 4-0 PS2 18 (SUTURE) ×2 IMPLANT
SUT VIC AB 2-0 CT1 27 (SUTURE) ×1
SUT VIC AB 2-0 CT1 TAPERPNT 27 (SUTURE) ×1 IMPLANT
TAPE LABRALWHITE 1.5X36 (TAPE) ×2 IMPLANT
TAPE SUT LABRALTAP WHT/BLK (SUTURE) ×2 IMPLANT
TOWEL OR 17X26 10 PK STRL BLUE (TOWEL DISPOSABLE) ×2 IMPLANT
TOWEL OR NON WOVEN STRL DISP B (DISPOSABLE) ×2 IMPLANT
TOWER SMARTMIX MINI (MISCELLANEOUS) ×1 IMPLANT
WATER STERILE IRR 1000ML POUR (IV SOLUTION) ×4 IMPLANT

## 2019-02-04 NOTE — Transfer of Care (Signed)
Immediate Anesthesia Transfer of Care Note  Patient: Dalton Delacruz  Procedure(s) Performed: TOTAL SHOULDER ARTHROPLASTY (Left Shoulder)  Patient Location: PACU  Anesthesia Type:GA combined with regional for post-op pain  Level of Consciousness: awake, alert  and oriented  Airway & Oxygen Therapy: Patient Spontanous Breathing and Patient connected to face mask oxygen  Post-op Assessment: Report given to RN and Post -op Vital signs reviewed and stable  Post vital signs: Reviewed and stable  Last Vitals:  Vitals Value Taken Time  BP 160/84 02/04/19 1306  Temp    Pulse 75 02/04/19 1308  Resp 17 02/04/19 1308  SpO2 100 % 02/04/19 1308  Vitals shown include unvalidated device data.  Last Pain:  Vitals:   02/04/19 1005  TempSrc:   PainSc: 0-No pain      Patients Stated Pain Goal: 4 ([id number])  Complications: No apparent anesthesia complications

## 2019-02-04 NOTE — Anesthesia Postprocedure Evaluation (Signed)
Anesthesia Post Note  Patient: Glendal Marks  Procedure(s) Performed: TOTAL SHOULDER ARTHROPLASTY (Left Shoulder)     Patient location during evaluation: PACU Anesthesia Type: General Level of consciousness: awake and alert Pain management: pain level controlled Vital Signs Assessment: post-procedure vital signs reviewed and stable Respiratory status: spontaneous breathing, nonlabored ventilation, respiratory function stable and patient connected to nasal cannula oxygen Cardiovascular status: blood pressure returned to baseline and stable Postop Assessment: no apparent nausea or vomiting Anesthetic complications: no    Last Vitals:  Vitals:   02/04/19 1307 02/04/19 1315  BP: (!) 160/84 (!) 143/85  Pulse: 79 73  Resp: 17 13  Temp: 37.1 C   SpO2: 100% 100%    Last Pain:  Vitals:   02/04/19 1315  TempSrc:   PainSc: Asleep                 Masa Lubin

## 2019-02-04 NOTE — H&P (Signed)
Dalton Delacruz is an 68 y.o. male.   Chief Complaint: L shoulder pain and dysfunction HPI: Endstage L shoulder arthritis with significant pain and dysfunction, failed conservative measures.  Pain interferes with sleep and quality of life.   Past Medical History:  Diagnosis Date  . Displaced spiral fracture of shaft of left humerus ~ 2002  . GERD (gastroesophageal reflux disease)    with LPR  . Hypertension     Past Surgical History:  Procedure Laterality Date  . CATARACT EXTRACTION Bilateral    2015  . COLONOSCOPY     2010  . COLONOSCOPY WITH PROPOFOL N/A 12/15/2018   Procedure: COLONOSCOPY WITH PROPOFOL;  Surgeon: [name], MD;  Location: [facility] ENDOSCOPY;  Service: Gastroenterology;  Laterality: N/A;  . ESOPHAGOGASTRODUODENOSCOPY (EGD) WITH PROPOFOL N/A 12/15/2018   Procedure: ESOPHAGOGASTRODUODENOSCOPY (EGD) WITH PROPOFOL;  Surgeon: [name], MD;  Location: [facility] ENDOSCOPY;  Service: Gastroenterology;  Laterality: N/A;  . FISTULOTOMY  03/04/13  . VASECTOMY      Family History  Problem Relation Age of Onset  . Cancer Mother        Breast  . Diabetes Mother   . Hyperlipidemia Mother   . Hypertension Mother   . Heart disease Mother   . Ulcers Father        stomach ulcers  . Heart disease Father   . Diabetes Brother   . Prostate cancer Neg Hx   . Colon cancer Neg Hx    Social History:  reports that he quit smoking about 8 years ago. His smoking use included cigars. He has a 5.00 pack-year smoking history. He has never used smokeless tobacco. He reports current alcohol use. He reports that he does not use drugs.  Allergies:  Allergies  Allergen Reactions  . Lisinopril Cough    Medications Prior to Admission  Medication Sig Dispense Refill  . aspirin EC 81 MG tablet Take 1 tablet (81 mg total) by mouth daily.    Marland Kitchen ibuprofen (ADVIL) 200 MG tablet Take 400 mg by mouth every 8 (eight) hours as needed (for pain.).    Marland Kitchen omeprazole (PRILOSEC) 40 MG capsule Take 1  capsule (40 mg total) by mouth 2 (two) times daily. 180 capsule 3  . tamsulosin (FLOMAX) 0.4 MG CAPS capsule Take 1 capsule (0.4 mg total) by mouth daily. (Patient not taking: Reported on 01/28/2019)    . Xylitol (XYLIMELTS) 550 MG DISK Use as directed 2 tablets in the mouth or throat Nightly. With an extra dose in the day if needed. (Patient taking differently: Use as directed 2 tablets in the mouth or throat at bedtime. )      No results found for this or any previous visit (from the past 48 hour(s)). No results found.  Review of Systems  All other systems reviewed and are negative.   Blood pressure (!) 141/87, pulse (!) 58, temperature 98.7 F (37.1 C), temperature source Oral, resp. rate (!) 9, height 5\' 11"  (1.803 m), weight 95.8 kg, SpO2 98 %. Physical Exam  Constitutional: He is oriented to person, place, and time. He appears well-developed and well-nourished.  HENT:  Head: Atraumatic.  Eyes: EOM are normal.  Cardiovascular: Intact distal pulses.  Respiratory: Effort normal.  Musculoskeletal:     Comments: L shoulder pain with limited ROM.   Neurological: He is alert and oriented to person, place, and time.  Skin: Skin is warm and dry.  Psychiatric: He has a normal mood and affect.     Assessment/Plan L shoulder  endstage OA with severe stiffness Plan L TSA Risks / benefits of surgery discussed Consent on chart  NPO for OR Preop antibiotics   Isabella Stalling, MD 02/04/2019, 9:45 AM

## 2019-02-04 NOTE — Progress Notes (Addendum)
[id number] Time out 0930 Meds given [place] [facility] Dr. Ambrose Pancoast with left, ultrasound guided, interscalene  block. Side rails up, monitors on throughout procedure. See vital signs in flow sheet. Tolerated Procedure well. Exparel used.

## 2019-02-04 NOTE — Anesthesia Preprocedure Evaluation (Signed)
Anesthesia Evaluation  Patient identified by MRN, date of birth, ID band Patient awake    Reviewed: Allergy & Precautions, H&P , NPO status , Patient's Chart, lab work & pertinent test results  History of Anesthesia Complications Negative for: history of anesthetic complications  Airway Mallampati: II  TM Distance: >3 FB Neck ROM: Full    Dental no notable dental hx. (+) Teeth Intact, Dental Advisory Given   Pulmonary neg pulmonary ROS, neg sleep apnea, neg COPD, former smoker,    breath sounds clear to auscultation- rhonchi (-) wheezing      Cardiovascular hypertension, (-) CAD, (-) Past MI, (-) Cardiac Stents and (-) CABG  Rhythm:Regular Rate:Normal - Systolic murmurs and - Diastolic murmurs    Neuro/Psych neg Seizures negative neurological ROS  negative psych ROS   GI/Hepatic Neg liver ROS, GERD  ,  Endo/Other  negative endocrine ROSneg diabetes  Renal/GU negative Renal ROS     Musculoskeletal negative musculoskeletal ROS (+)   Abdominal (+) - obese,   Peds  Hematology negative hematology ROS (+)   Anesthesia Other Findings   Reproductive/Obstetrics                             Anesthesia Physical  Anesthesia Plan  ASA: II  Anesthesia Plan: General   Post-op Pain Management:    Induction: Intravenous  PONV Risk Score and Plan: 1 and Propofol infusion  Airway Management Planned: Natural Airway  Additional Equipment:   Intra-op Plan:   Post-operative Plan:   Informed Consent: I have reviewed the patients History and Physical, chart, labs and discussed the procedure including the risks, benefits and alternatives for the proposed anesthesia with the patient or authorized representative who has indicated his/her understanding and acceptance.     Dental advisory given  Plan Discussed with: CRNA, Anesthesiologist and Surgeon  Anesthesia Plan Comments:          Anesthesia Quick Evaluation

## 2019-02-04 NOTE — Op Note (Signed)
Procedure(s): TOTAL SHOULDER ARTHROPLASTY Procedure Note  Dalton Delacruz male 68 y.o. 02/04/2019  Procedure(s) and Anesthesia Type:  LEFT TOTAL SHOULDER ARTHROPLASTY - Choice  Surgeon(s) and Role:    Tania Ade, MD - Primary   Indications:  68 y.o. male  With endstage left shoulder arthritis. Pain and dysfunction interfered with quality of life and nonoperative treatment with activity modification, NSAIDS and injections failed.     Surgeon: Isabella Stalling   Assistants: Jeanmarie Hubert PA-C [facility] was present and scrubbed throughout the procedure and was essential in positioning, retraction, exposure, and closure)  Anesthesia: General endotracheal anesthesia with preoperative interscalene block given by the attending anesthesiologist    Procedure Detail  TOTAL SHOULDER ARTHROPLASTY  Findings: Tornier flex anatomic press-fit size 5 stem with a 52 x 19 head, cemented size 50 large Cortiloc glenoid.   A lesser tuberosity osteotomy was performed and repaired at the conclusion of the procedure.  He had significant peripheral osteophytosis and severe preoperative stiffness.  Estimated Blood Loss:  200 mL         Drains: None   Blood Given: none          Specimens: none        Complications:  * No complications entered in OR log *         Disposition: PACU - hemodynamically stable.         Condition: stable    Procedure:   The patient was identified in the preoperative holding area where I personally marked the operative extremity after verifying with the patient and consent. He  was taken to the operating room where He was transferred to the   operative table.  The patient received an interscalene block in   the holding area by the attending anesthesiologist.  General anesthesia was induced   in the operating room without complication.  The patient did receive IV  Ancef prior to the commencement of the procedure.  The patient was   placed in the  beach-chair position with the back raised about 30   degrees.  The nonoperative extremity and head and neck were carefully   positioned and padded protecting against neurovascular compromise.  The   left upper extremity was then prepped and draped in the standard sterile   fashion.    The appropriate operative time-out was performed with   Anesthesia, the perioperative staff, as well as myself and we all agreed   that the left side was the correct operative site.  An approximately   10 cm incision was made from the tip of the coracoid to the center point of the   humerus at the level of the axilla.  Dissection was carried down sharply   through subcutaneous tissues and cephalic vein was identified and taken   laterally with the deltoid.  The pectoralis major was taken medially.  The   upper 1 cm of the pectoralis major was released from its attachment on   the humerus.  The clavipectoral fascia was incised just lateral to the   conjoined tendon.  This incision was carried up to but not into the   coracoacromial ligament.  Digital palpation was used to prove   integrity of the axillary nerve which was protected throughout the   procedure.  Musculocutaneous nerve was not palpated in the operative   field.  Conjoined tendon was then retracted gently medially and the   deltoid laterally.  Anterior circumflex humeral vessels were clamped and   coagulated.  The soft tissues overlying the biceps was incised and this   incision was carried across the transverse humeral ligament to the base   of the coracoid.  The biceps was noted to be severely degenerated. It was released from the superior labrum. The biceps was then tenodesed to the soft tissue just above   pectoralis major and the remaining portion of the biceps superiorly was   excised.  An osteotomy was performed at the lesser tuberosity.  The capsule was then   released all the way down to the 6 o'clock position of the humeral head.  He had a  significant amount of peripheral osteophytosis both superior anterior inferior and posterior which was significantly above average even for a very arthritic shoulder.  The inferior humeral osteophyte extended down over an inch.  The capsular release in this area was done very carefully and slowly under direct visualization as to not risk any neurovascular damage.  The osteotomes were used to remove extensive amount of anterior and inferior osteophyte.  I even had to remove some posterior osteophyte in order to be able to dislocate the joint.  The humeral head was then delivered with simultaneous adduction,   extension and external rotation.  The remaining humeral osteophytes were removed   and the anatomic neck of the humerus was marked and cut free hand at   approximately 25 degrees retroversion within about 3 mm of the cuff   reflection posteriorly.  The head size was estimated to be a 52 medium   offset.  The head was abnormally oblong.  At that point, the humeral head was retracted posteriorly with   a Fukuda retractor.   Remaining portion of the capsule was released at the base of the   coracoid.  The remaining biceps anchor and the entire anterior-inferior   labrum was excised.  The posterior labrum was also excised but the   posterior capsule was not released.  The guidepin was placed bicortically with non-elevated guide.  The reamer was used to ream to concentric bone with punctate bleeding.  This gave an excellent concentric surface.  The center hole was then drilled for an anchor peg glenoid followed by the three peripheral holes and none of the holes   exited the glenoid wall.  I then pulse irrigated these holes and dried   them with Surgicel.  The three peripheral holes were then   pressurized cemented and the anchor peg glenoid was placed and impacted   with an excellent fit.  The glenoid was a 50 large component.  The proximal humerus was then again exposed taking care not to displace  the glenoid.    The entry awl was used followed by sounding reamers and then sequentially broached from size 1 to 5. This was then left in place and the calcar planer was used. Trial head was placed with a 52x19 high offset.  With the trial implantation of the component,  there was approximately 50% posterior translation with immediate snap back to the   anatomic position.     The trial was removed and the final implant was prepared on a back table.  The trial was removed and the final implant was prepared on a back table.   3 small holes were drilled on the medial side of the lesser tuberosity osteotomy, through which 2 labral tapes were passed. The implant was then placed through the loop of the 2 labral tapes and impacted with an excellent press-fit. This achieved excellent anatomic  reconstruction of the proximal humerus.  The joint was then copiously irrigated with pulse lavage.  The subscapularis and   lesser tuberosity osteotomy were then repaired using the 2 labral tapes previously passed in a double row fashion with horizontal mattress sutures medially brought over through bone tunnels tied over a bone bridge laterally.   One #1 Ethibond was placed at the rotator interval just above   the lesser tuberosity. Copious irrigation was used. Skin was closed with 2-0 Vicryl sutures in the deep dermal layer and 4-0 Monocryl in a subcuticular  running fashion.  Sterile dressings were then applied including Aquacel.  The patient was placed in a sling and allowed to awaken from general anesthesia and taken to the recovery room in stable condition.      POSTOPERATIVE PLAN:  Early passive range of motion will be allowed with the goal of 0 degrees external rotation and 90 degrees forward elevation.  No internal rotation at this time.  No active motion of the arm until the lesser tuberosity heals.  The patient will likely be kept in the hospital for 1 day and then discharged home.

## 2019-02-04 NOTE — Anesthesia Procedure Notes (Signed)
Procedure Name: Intubation Date/Time: 02/04/2019 10:44 AM Performed by: Genelle Bal, CRNA Pre-anesthesia Checklist: Patient identified, Emergency Drugs available, Suction available and Patient being monitored Patient Re-evaluated:Patient Re-evaluated prior to induction Oxygen Delivery Method: Circle system utilized Preoxygenation: Pre-oxygenation with 100% oxygen Induction Type: IV induction Ventilation: Mask ventilation without difficulty Laryngoscope Size: Miller and 2 Grade View: Grade I Tube type: Oral Tube size: 8.0 mm Number of attempts: 1 Airway Equipment and Method: Stylet and Oral airway Placement Confirmation: ETT inserted through vocal cords under direct vision,  positive ETCO2 and breath sounds checked- equal and bilateral Secured at: 24 cm Tube secured with: Tape Dental Injury: Teeth and Oropharynx as per pre-operative assessment

## 2019-02-04 NOTE — Discharge Instructions (Signed)
Discharge Instructions after Total Shoulder Arthroplasty   . A sling has been provided for you. Remove the sling 5 times each day to perform motion exercises. Use ice on the shoulder intermittently over the first 48 hours after surgery.  . Pain medication has been prescribed for you.  . Use your medication liberally over the first 48 hours, and then begin to taper your use. You may take Extra Strength Tylenol or Tylenol only in place of the pain pills. DO NOT take ANY nonsteroidal anti-inflammatory pain medications: Advil, Motrin, Ibuprofen, Aleve, Naproxen, or Naprosyn. . Take one aspirin a day for 2 weeks after surgery, unless you have an aspirin sensitivity/allergy or asthma. . Leave your dressing on until your first follow up visit.  You may shower with the dressing.  Hold your arm as if you still have your sling on while you shower. . Active reaching and lifting are not permitted. You may use the operative arm for activities of daily living that do not require the operative arm to leave the side of the body, such as eating, drinking, bathing, etc.  . Three to 5 times each day you should perform assisted overhead reaching and external rotation (outward turning) exercises with the operative arm. You were taught these exercises prior to discharge. Both exercises should be done with the non-operative arm used as the "therapist arm" while the operative arm remains relaxed. Ten of each exercise should be done three to five times each day.   Overhead reach is helping to lift your stiff arm up as high as it will go. To stretch your overhead reach, lie flat on your back, relax, and grasp the wrist of the tight shoulder with your opposite hand. Using the power in your opposite arm, bring the stiff arm up as far as it is comfortable. Start holding it for ten seconds and then work up to where you can hold it for a count of 30. Breathe slowly and deeply while the arm is moved. Repeat this stretch ten times,  trying to help the ar up a little higher each time.     External rotation is turning the arm out to the side while your elbow stays close to your body. External rotation is best stretched while you are lying on your back. Hold a cane, yardstick, broom handle, or dowel in both hands. Bend both elbows to a right angle. Use steady, gentle force from your normal arm to rotate the hand of the stiff shoulder out away from your body. Continue the rotation until it is straight in front of you holding it there for a count of 10. Do not go beyond this level of rotation until seen back by Dr. Chandler. Repeat this exercise ten times slowly.      Please call [phone] during normal business hours or [phone] after hours for any problems. Including the following:  - excessive redness of the incisions - drainage for more than 4 days - fever of more than 101.5 F  *Please note that pain medications will not be refilled after hours or on weekends.    

## 2019-02-04 NOTE — Anesthesia Procedure Notes (Signed)
Anesthesia Regional Block: Interscalene brachial plexus block   Pre-Anesthetic Checklist: ,, timeout performed, Correct Patient, Correct Site, Correct Laterality, Correct Procedure, Correct Position, site marked, Risks and benefits discussed,  Surgical consent,  Pre-op evaluation,  At surgeon's request and post-op pain management  Laterality: Left  Prep: chloraprep       Needles:  Injection technique: Single-shot  Needle Type: Echogenic Stimulator Needle     Needle Length: 5cm  Needle Gauge: 22     Additional Needles:   Procedures:, nerve stimulator,,, ultrasound used (permanent image in chart),,,,   Nerve Stimulator or Paresthesia:  Response: hand, 0.45 mA,   Additional Responses:   Narrative:  Start time: 02/04/2019 9:31 AM End time: 02/04/2019 9:36 AM Injection made incrementally with aspirations every 5 mL.  Performed by: Personally  Anesthesiologist: Janeece Riggers, MD  Additional Notes: Functioning IV was confirmed and monitors were applied.  A 68mm 22ga Arrow echogenic stimulator needle was used. Sterile prep and drape,hand hygiene and sterile gloves were used. Ultrasound guidance: relevant anatomy identified, needle position confirmed, local anesthetic spread visualized around nerve(s)., vascular puncture avoided.  Image printed for medical record. Negative aspiration and negative test dose prior to incremental administration of local anesthetic. The patient tolerated the procedure well.

## 2019-02-05 LAB — CBC
HCT: 37.8 % — ABNORMAL LOW (ref 39.0–52.0)
Hemoglobin: 12.2 g/dL — ABNORMAL LOW (ref 13.0–17.0)
MCH: 30.8 pg (ref 26.0–34.0)
MCHC: 32.3 g/dL (ref 30.0–36.0)
MCV: 95.5 fL (ref 80.0–100.0)
Platelets: 229 10*3/uL (ref 150–400)
RBC: 3.96 MIL/uL — ABNORMAL LOW (ref 4.22–5.81)
RDW: 12.1 % (ref 11.5–15.5)
WBC: 11.8 10*3/uL — ABNORMAL HIGH (ref 4.0–10.5)
nRBC: 0 % (ref 0.0–0.2)

## 2019-02-05 LAB — BASIC METABOLIC PANEL
Anion gap: 10 (ref 5–15)
BUN: 21 mg/dL (ref 8–23)
CO2: 21 mmol/L — ABNORMAL LOW (ref 22–32)
Calcium: 8 mg/dL — ABNORMAL LOW (ref 8.9–10.3)
Chloride: 106 mmol/L (ref 98–111)
Creatinine, Ser: 1.15 mg/dL (ref 0.61–1.24)
GFR calc Af Amer: >60 mL/min (ref >60)
GFR calc non Af Amer: >60 mL/min (ref >60)
Glucose, Bld: 146 mg/dL — ABNORMAL HIGH (ref 70–99)
Potassium: 3.9 mmol/L (ref 3.5–5.1)
Sodium: 137 mmol/L (ref 135–145)

## 2019-02-05 MED ORDER — OXYCODONE-ACETAMINOPHEN 5-325 MG PO TABS: ORAL_TABLET | ORAL | 0 refills | Status: DC

## 2019-02-05 MED ORDER — TIZANIDINE HCL 4 MG PO TABS: 4.0000 mg | ORAL_TABLET | Freq: Three times a day (TID) | ORAL | 1 refills | Status: DC | PRN

## 2019-02-05 NOTE — Discharge Summary (Signed)
Patient ID: Dalton Delacruz MRN: [id number] DOB/AGE: 67-17-1952 67 y.o.  Admit date: 02/04/2019 Discharge date: 02/05/2019  Admission Diagnoses:  Active Problems:   Status post total shoulder arthroplasty, left   Discharge Diagnoses:  Same  Past Medical History:  Diagnosis Date  . Displaced spiral fracture of shaft of left humerus ~ 2002  . GERD (gastroesophageal reflux disease)    with LPR  . Hypertension     Surgeries: Procedure(s): TOTAL SHOULDER ARTHROPLASTY on 02/04/2019   Consultants:   Discharged Condition: Improved  Hospital Course: Abdalla Adornetto is an 68 y.o. male who was admitted 02/04/2019 for operative treatment of left shoulder end stage OA. Patient has severe unremitting pain that affects sleep, daily activities, and work/hobbies. After pre-op clearance the patient was taken to the operating room on 02/04/2019 and underwent  Procedure(s): TOTAL SHOULDER ARTHROPLASTY.    Patient was given perioperative antibiotics:  Anti-infectives (From admission, onward)   Start     Dose/Rate Route Frequency Ordered Stop   02/04/19 1700  ceFAZolin (ANCEF) IVPB 2g/100 mL premix     2 g 200 mL/hr over 30 Minutes Intravenous Every 6 hours 02/04/19 1538 02/05/19 0525   02/04/19 0745  ceFAZolin (ANCEF) IVPB 2g/100 mL premix     2 g 200 mL/hr over 30 Minutes Intravenous On call to O.R. 02/04/19 0731 02/04/19 1116       Patient was given sequential compression devices, early ambulation, and asa to prevent DVT.  Patient benefited maximally from hospital stay and there were no complications.    Recent vital signs:  Patient Vitals for the past 24 hrs:  BP Temp Temp src Pulse Resp SpO2  02/05/19 0436 101/61 97.8 F (36.6 C) Oral 76 14 94 %  02/05/19 0045 128/75 97.6 F (36.4 C) Oral 80 14 96 %  02/04/19 2131 124/85 97.7 F (36.5 C) Oral 85 16 97 %  02/04/19 1849 (!) 143/91 98.4 F (36.9 C) Oral 88 16 97 %  02/04/19 1757 (!) 150/91 98.2 F (36.8 C) Oral 90 17 96 %   02/04/19 1700 139/83 98 F (36.7 C) Oral 85 17 95 %  02/04/19 1605 (!) 147/93 98.6 F (37 C) Oral 81 16 96 %  02/04/19 1501 (!) 155/92 - - 77 14 98 %  02/04/19 1445 (!) 143/91 98 F (36.7 C) - 73 13 99 %  02/04/19 1430 (!) 140/92 - - 97 19 99 %  02/04/19 1415 (!) 144/78 98 F (36.7 C) - 73 12 99 %  02/04/19 1400 134/76 - - 71 12 98 %  02/04/19 1345 133/82 - - 71 15 98 %  02/04/19 1330 (!) 147/76 - - 74 15 100 %  02/04/19 1315 (!) 143/85 - - 73 13 100 %  02/04/19 1307 (!) 160/84 98.8 F (37.1 C) - 79 17 100 %  02/04/19 1005 (!) 142/85 - - 63 12 100 %  02/04/19 1000 (!) 164/97 - - 72 14 99 %  02/04/19 0955 (!) 148/88 - - 65 13 98 %  02/04/19 0950 (!) 139/97 - - 60 12 99 %  02/04/19 0945 - - - 60 16 99 %  02/04/19 0940 (!) 141/87 - - (!) 58 (!) 9 98 %  02/04/19 0939 - - - 61 10 99 %  02/04/19 0938 - - - 64 12 98 %  02/04/19 0937 - - - 62 13 98 %  02/04/19 0936 - - - 62 18 99 %  02/04/19 0935 127/84 - - (!) 57 13  98 %  02/04/19 0934 - - - 64 14 99 %  02/04/19 0933 - - - 63 10 99 %  02/04/19 0932 - - - 64 14 99 %  02/04/19 0931 - - - 65 15 100 %  02/04/19 0930 (!) 144/93 - - 70 20 100 %     Recent laboratory studies:  Recent Labs    02/05/19 0321  WBC 11.8*  HGB 12.2*  HCT 37.8*  PLT 229  NA 137  K 3.9  CL 106  CO2 21*  BUN 21  CREATININE 1.15  GLUCOSE 146*  CALCIUM 8.0*     Discharge Medications:   Allergies as of 02/05/2019      Reactions   Lisinopril Cough      Medication List    STOP taking these medications   ibuprofen 200 MG tablet Commonly known as: ADVIL     TAKE these medications   aspirin EC 81 MG tablet Take 1 tablet (81 mg total) by mouth daily.   omeprazole 40 MG capsule Commonly known as: PRILOSEC Take 1 capsule (40 mg total) by mouth 2 (two) times daily.   oxyCODONE-acetaminophen 5-325 MG tablet Commonly known as: Percocet Take 1-2 tablets every 4 hours as needed for post operative pain. MAX 6/day   tamsulosin 0.4 MG Caps  capsule Commonly known as: FLOMAX Take 1 capsule (0.4 mg total) by mouth daily.   tiZANidine 4 MG tablet Commonly known as: Zanaflex Take 1 tablet (4 mg total) by mouth every 8 (eight) hours as needed for muscle spasms.   XyliMelts 550 MG Disk Generic drug: Xylitol Use as directed 2 tablets in the mouth or throat Nightly. With an extra dose in the day if needed. What changed:   when to take this  additional instructions       Diagnostic Studies: Dg Chest 2 View  Result Date: 01/28/2019 CLINICAL DATA:  Pre operative respiratory exam. The patient is to have left total shoulder arthroplasty. EXAM: CHEST - 2 VIEW COMPARISON:  09/03/2018 FINDINGS: The heart size and mediastinal contours are within normal limits. Both lungs are clear. Prominent arthritic changes of the left glenohumeral joint. The bones are otherwise normal. IMPRESSION: No active cardiopulmonary disease. Electronically Signed   By: Lorriane Shire M.D.   On: 01/28/2019 15:20   Dg Carlena Hurl Op [employer] Speech Path  Result Date: 01/25/2019 CLINICAL DATA:  Dysphagia. EXAM: MODIFIED BARIUM SWALLOW TECHNIQUE: Different consistencies of barium were administered orally to the patient by the Speech Pathologist. Imaging of the pharynx was performed in the lateral projection. The radiologist was present in the fluoroscopy room for this study, providing personal supervision. FLUOROSCOPY TIME:  Fluoroscopy Time:  0.5 minutes Radiation Exposure Index (if provided by the fluoroscopic device): 1.7 mGy Number of Acquired Spot Images: 0 COMPARISON:  None. FINDINGS: No pharyngeal penetration or aspiration was observed with varying consistencies of barium. IMPRESSION: No penetration or aspiration was observed. Please refer to the Speech Pathologists report for complete details and recommendations. Electronically Signed   By: Davina Poke M.D.   On: 01/25/2019 14:22   Dg Shoulder Left Port  Result Date: 02/04/2019 CLINICAL DATA:  Postop  from left shoulder replacement. EXAM: LEFT SHOULDER COMPARISON:  09/03/2018 FINDINGS: Left shoulder arthroplasty appears well seated and well aligned. No acute fracture or evidence of an operative complication. IMPRESSION: Well-positioned left shoulder arthroplasty. Electronically Signed   By: Lajean Manes M.D.   On: 02/04/2019 15:02    Disposition: Discharge disposition: 01-Home or Self  Care       Discharge Instructions    Call MD / Call 911   Complete by: As directed    If you experience chest pain or shortness of breath, CALL 911 and be transported to the hospital emergency room.  If you develope a fever above 101 F, pus (white drainage) or increased drainage or redness at the wound, or calf pain, call your surgeon's office.   Constipation Prevention   Complete by: As directed    Drink plenty of fluids.  Prune juice may be helpful.  You may use a stool softener, such as Colace (over the counter) 100 mg twice a day.  Use MiraLax (over the counter) for constipation as needed.   Diet - low sodium heart healthy   Complete by: As directed    Increase activity slowly as tolerated   Complete by: As directed       Follow-up Information    Tania Ade, MD. Schedule an appointment as soon as possible for a visit in 2 weeks.   Specialty: Orthopedic Surgery Contact information: [address] [place] [address] [phone]            Signed: Grier Mitts 02/05/2019, 8:20 AM

## 2019-02-05 NOTE — Progress Notes (Signed)
   PATIENT ID: Dalton Delacruz   1 Day Post-Op Procedure(s) (LRB): TOTAL SHOULDER ARTHROPLASTY (Left)  Subjective: Doing well, min pain, block still working  Objective:  Vitals:   02/05/19 0045 02/05/19 0436  BP: 128/75 101/61  Pulse: 80 76  Resp: 14 14  Temp: 97.6 F (36.4 C) 97.8 F (36.6 C)  SpO2: 96% 94%     L shoulder dressing c/d/i Wiggles fingers, min distal swelling  Labs:  Recent Labs    02/05/19 0321  HGB 12.2*   Recent Labs    02/05/19 0321  WBC 11.8*  RBC 3.96*  HCT 37.8*  PLT 229   Recent Labs    02/05/19 0321  NA 137  K 3.9  CL 106  CO2 21*  BUN 21  CREATININE 1.15  GLUCOSE 146*  CALCIUM 8.0*    Assessment and Plan: 1 day s/p L TSA OT- prom limited to 90 ff 0 er D/c home when cleared by OT Fu with dr.chandler in 2 weeks  VTE proph: asa,scds

## 2019-02-05 NOTE — Evaluation (Signed)
Occupational Therapy Evaluation Patient Details Name: Dalton Delacruz MRN: [id number] DOB: 1950-06-06 Today's Date: 02/05/2019    History of Present Illness 68 y o man admitted for L TSA.  H/O L shoulder sx in '02 due to spiral fx and HTN   Clinical Impression   Pt was admitted for the above sx. All education was completed and handouts were given.  Pt will follow up with Dr Tamera Punt for further rehab    Follow Up Recommendations  Supervision/Assistance - 24 hour;Follow surgeon's recommendation for DC plan and follow-up therapies    Equipment Recommendations  None recommended by OT    Recommendations for Other Services       Precautions / Restrictions Precautions Precautions: Shoulder Type of Shoulder Precautions: sling on except for bathing, dressing and exercise.  May move elbow wrist and fingers, May perform PROM up to 90 FF and neutral ER (gravity eliminated) NO ABD, NO ACTIVE SHOULDER MOVEMENT Shoulder Interventions: Shoulder sling/immobilizer Precaution Booklet Issued: Yes (comment) Restrictions Weight Bearing Restrictions: Yes LUE Weight Bearing: Non weight bearing      Mobility Bed Mobility               General bed mobility comments: min A for OOB.  Pt plans to sleep in recliner  Transfers Overall transfer level: Independent                    Balance Overall balance assessment: No apparent balance deficits (not formally assessed)                                         ADL either performed or assessed with clinical judgement   ADL Overall ADL's : Needs assistance/impaired Eating/Feeding: Set up   Grooming: Set up   Upper Body Bathing: Minimal assistance;Moderate assistance   Lower Body Bathing: Minimal assistance   Upper Body Dressing : Moderate assistance   Lower Body Dressing: Moderate assistance   Toilet Transfer: Supervision/safety   Toileting- Clothing Manipulation and Hygiene: Supervision/safety          General ADL Comments: educated on shoulder protocol.  Performed PROM for FF and neutral rotation, elbow wrist and fingers.  Assisted pt with dressing and walking around the unit:  he is steady     Wellsite geologist     Praxis      Pertinent Vitals/Pain Pain Assessment: Faces Faces Pain Scale: Hurts little more Pain Location: L shoulder Pain Descriptors / Indicators: Sore Pain Intervention(s): Limited activity within patient's tolerance;Monitored during session;Premedicated before session;Repositioned     Hand Dominance Right   Extremity/Trunk Assessment Upper Extremity Assessment Upper Extremity Assessment: (able to move L elbow, wrist and fingers)           Communication Communication Communication: No difficulties   Cognition Arousal/Alertness: Awake/alert Behavior During Therapy: WFL for tasks assessed/performed Overall Cognitive Status: Within Functional Limits for tasks assessed                                     General Comments  educated pt on ice machine    Exercises Exercises: (see adls)   Shoulder Instructions      Home Living Family/patient expects to be discharged to:: Private residence Living Arrangements: Spouse/significant other  Home Equipment: None          Prior Functioning/Environment Level of Independence: Independent        Comments: loves golf        OT Problem List:        OT Treatment/Interventions:      OT Goals(Current goals can be found in the care plan section) Acute Rehab OT Goals Patient Stated Goal: return to golfing OT Goal Formulation: All assessment and education complete, DC therapy  OT Frequency:     Barriers to D/C:            Co-evaluation              AM-PAC OT "6 Clicks" Daily Activity     Outcome Measure Help from another person eating meals?: A Little Help from another person taking care of personal grooming?: A Little Help  from another person toileting, which includes using toliet, bedpan, or urinal?: A Little Help from another person bathing (including washing, rinsing, drying)?: A Lot Help from another person to put on and taking off regular upper body clothing?: A Lot Help from another person to put on and taking off regular lower body clothing?: A Lot 6 Click Score: 15   End of Session Nurse Communication: (ready for d/c)  Activity Tolerance: Patient tolerated treatment well Patient left: in chair;with call bell/phone within reach  OT Visit Diagnosis: Pain Pain - Right/Left: Left Pain - part of body: Shoulder                Time: [id number] OT Time Calculation (min): 40 min Charges:  OT General Charges $OT Visit: 1 Visit OT Evaluation $OT Eval Low Complexity: 1 Low OT Treatments $Self Care/Home Management : 8-22 mins $Therapeutic Exercise: 8-22 mins  Lesle Chris, OTR/L Acute Rehabilitation Services [phone] [facility] pager [phone] office 02/05/2019  [place] 02/05/2019, 9:21 AM

## 2019-02-09 ENCOUNTER — Encounter (HOSPITAL_COMMUNITY): Payer: Self-pay | Admitting: Orthopedic Surgery

## 2019-02-15 DIAGNOSIS — Z9889 Other specified postprocedural states: Secondary | ICD-10-CM | POA: Diagnosis not present

## 2019-02-15 DIAGNOSIS — M19012 Primary osteoarthritis, left shoulder: Secondary | ICD-10-CM | POA: Diagnosis not present

## 2019-02-22 DIAGNOSIS — K219 Gastro-esophageal reflux disease without esophagitis: Secondary | ICD-10-CM | POA: Diagnosis not present

## 2019-02-22 DIAGNOSIS — R05 Cough: Secondary | ICD-10-CM | POA: Diagnosis not present

## 2019-02-22 DIAGNOSIS — R682 Dry mouth, unspecified: Secondary | ICD-10-CM | POA: Diagnosis not present

## 2019-03-08 ENCOUNTER — Encounter: Payer: Self-pay | Admitting: Gastroenterology

## 2019-03-08 ENCOUNTER — Other Ambulatory Visit: Payer: Self-pay

## 2019-03-08 ENCOUNTER — Ambulatory Visit: Payer: Medicare HMO | Admitting: Gastroenterology

## 2019-03-08 VITALS — BP 151/89 | HR 71 | Temp 98.0°F | Ht 71.0 in | Wt 214.2 lb

## 2019-03-08 DIAGNOSIS — R05 Cough: Secondary | ICD-10-CM | POA: Diagnosis not present

## 2019-03-08 DIAGNOSIS — R053 Chronic cough: Secondary | ICD-10-CM

## 2019-03-08 NOTE — Progress Notes (Signed)
Dalton Bellows MD, MRCP(U.K) [address]  [place]  [name], [place] [address]  Main: [phone]  Fax: [phone]   Primary Care Physician: Tonia Ghent, MD  Primary Gastroenterologist:  Dr. Jonathon Delacruz   No chief complaint on file.   HPI: Dalton Delacruz is a 68 y.o. male   Summary of history :  Initially seen on 11/23/2018 for a colonoscopy and upper GI symptoms.  He had been having cough for over a year usually in the morning for the first 20 minutes when he wakes up.  Denies any typical symptoms for acid reflux and was on Pepcid twice a day 20 mg.  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  His swallowing was not as good as it used to be.  Plan was to empirically treat him with Prilosec 40 mg once a day and watch for improvement.  At the time of his colonoscopy symptoms were  not better so hence we proceeded with a upper endoscopy as well.  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  EGD also performed at the same time showed no abnormality and biopsies esophagus showed no abnormalities.  Since starting Prilosec 40 mg a day he has found no benefit.  He still coughs.  Some sensation of dysphagia.  No other issue.  Does attest to features suggestive of postnasal drip at night  Interval history   01/06/2019-03/08/2019  01/25/2019: Modified barium swallow: No evidence of aspiration   Says has been commenced on gabapentin about 4 to 5 weeks back since then a significant improvement in his chronic cough.  Also started on artificial saliva.  Significantly helped with cough.  Current Outpatient Medications  Medication Sig Dispense Refill  . aspirin EC 81 MG tablet Take 1 tablet (81 mg total) by mouth daily.    Marland Kitchen omeprazole (PRILOSEC) 40 MG capsule Take 1 capsule (40 mg total) by mouth 2 (two) times daily. 180 capsule 3  . oxyCODONE-acetaminophen (PERCOCET) 5-325 MG tablet Take 1-2 tablets every 4 hours as needed for post  operative pain. MAX 6/day 30 tablet 0  . tamsulosin (FLOMAX) 0.4 MG CAPS capsule Take 1 capsule (0.4 mg total) by mouth daily. (Patient not taking: Reported on 01/28/2019)    . tiZANidine (ZANAFLEX) 4 MG tablet Take 1 tablet (4 mg total) by mouth every 8 (eight) hours as needed for muscle spasms. 30 tablet 1  . Xylitol (XYLIMELTS) 550 MG DISK Use as directed 2 tablets in the mouth or throat Nightly. With an extra dose in the day if needed. (Patient taking differently: Use as directed 2 tablets in the mouth or throat at bedtime. )     No current facility-administered medications for this visit.    Allergies as of 03/08/2019 - Review Complete 02/04/2019  Allergen Reaction Noted  . Lisinopril Cough 02/19/2012    ROS:  General: Negative for anorexia, weight loss, fever, chills, fatigue, weakness. ENT: Negative for hoarseness, difficulty swallowing , nasal congestion. CV: Negative for chest pain, angina, palpitations, dyspnea on exertion, peripheral edema.  Respiratory: Negative for dyspnea at rest, dyspnea on exertion, cough, sputum, wheezing.  GI: See history of present illness. GU:  Negative for dysuria, hematuria, urinary incontinence, urinary frequency, nocturnal urination.  Endo: Negative for unusual weight change.    Physical Examination:   There were no vitals taken for this visit.  General: Well-nourished, well-developed in no acute distress.  Eyes: No icterus. Conjunctivae pink. Neuro: Alert and oriented x  3.  Grossly intact. Skin: Warm and dry, no jaundice.   Psych: Alert and cooperative, normal mood and affect.   Imaging Studies: No results found.  Assessment and Plan:   Dalton Delacruz is a 68 y.o. y/o male  here to follow-up for upper GI symptoms at his initial visit.  Some concern for dysphagia.  EGD did not show any abnormalities such as a stricture.  Biopsies of the esophagus were completely normal.  He still continues to have the chronic cough particularly in the  morning and has not responded at all to omeprazole.  There was no evidence of esophagitis.    Seen by ENT and commenced on gabapentin and since then significant improvement with his chronic cough.  No history of heartburn or reflux.  I suggested he stop his PPI and see how it goes.  If he has worsening of symptoms to restart it and call my office for a follow-up visit.  If he has no worsening of symptoms to stay off the PPI as it has not helped him to begin with. Modified barium swallow showed no features of oropharyngeal dysphagia  Dr Dalton Bellows  MD,MRCP [facility]) Follow up in as needed

## 2019-03-15 DIAGNOSIS — Z96612 Presence of left artificial shoulder joint: Secondary | ICD-10-CM | POA: Diagnosis not present

## 2019-03-15 DIAGNOSIS — Z471 Aftercare following joint replacement surgery: Secondary | ICD-10-CM | POA: Diagnosis not present

## 2019-03-16 ENCOUNTER — Encounter: Payer: Self-pay | Admitting: Family Medicine

## 2019-03-17 ENCOUNTER — Other Ambulatory Visit: Payer: Self-pay | Admitting: Family Medicine

## 2019-03-17 MED ORDER — TAMSULOSIN HCL 0.4 MG PO CAPS: 0.4000 mg | ORAL_CAPSULE | Freq: Every day | ORAL | 2 refills | Status: DC

## 2019-03-18 DIAGNOSIS — M25612 Stiffness of left shoulder, not elsewhere classified: Secondary | ICD-10-CM | POA: Diagnosis not present

## 2019-03-18 DIAGNOSIS — Z96612 Presence of left artificial shoulder joint: Secondary | ICD-10-CM | POA: Diagnosis not present

## 2019-03-23 DIAGNOSIS — Z96612 Presence of left artificial shoulder joint: Secondary | ICD-10-CM | POA: Diagnosis not present

## 2019-03-23 DIAGNOSIS — M25612 Stiffness of left shoulder, not elsewhere classified: Secondary | ICD-10-CM | POA: Diagnosis not present

## 2019-03-25 DIAGNOSIS — Z96612 Presence of left artificial shoulder joint: Secondary | ICD-10-CM | POA: Diagnosis not present

## 2019-03-25 DIAGNOSIS — M25612 Stiffness of left shoulder, not elsewhere classified: Secondary | ICD-10-CM | POA: Diagnosis not present

## 2019-03-29 DIAGNOSIS — M25612 Stiffness of left shoulder, not elsewhere classified: Secondary | ICD-10-CM | POA: Diagnosis not present

## 2019-03-29 DIAGNOSIS — Z96612 Presence of left artificial shoulder joint: Secondary | ICD-10-CM | POA: Diagnosis not present

## 2019-03-31 DIAGNOSIS — Z96612 Presence of left artificial shoulder joint: Secondary | ICD-10-CM | POA: Diagnosis not present

## 2019-03-31 DIAGNOSIS — M25612 Stiffness of left shoulder, not elsewhere classified: Secondary | ICD-10-CM | POA: Diagnosis not present

## 2019-04-05 DIAGNOSIS — Z96612 Presence of left artificial shoulder joint: Secondary | ICD-10-CM | POA: Diagnosis not present

## 2019-04-05 DIAGNOSIS — M25612 Stiffness of left shoulder, not elsewhere classified: Secondary | ICD-10-CM | POA: Diagnosis not present

## 2019-04-06 ENCOUNTER — Telehealth: Payer: Self-pay

## 2019-04-06 ENCOUNTER — Telehealth: Payer: Self-pay | Admitting: Lab

## 2019-04-06 MED ORDER — SHINGRIX 50 MCG/0.5ML IM SUSR: 0.5000 mL | INTRAMUSCULAR | 1 refills | Status: DC

## 2019-04-06 NOTE — Telephone Encounter (Signed)
Wife was seen by [facility] today and wanted me to send message to you requesting Rx print out for Shingrix vaccine to take to pharmacy... pt last OV 12/2018 acute... CPE 08/2018... please advise

## 2019-04-06 NOTE — Telephone Encounter (Signed)
Called Pt and spoke to him about his Rx. Pt stated he will come to the office and pick up his Rx today.

## 2019-04-06 NOTE — Telephone Encounter (Signed)
Printed.  Thanks.  

## 2019-04-06 NOTE — Telephone Encounter (Signed)
Called Pt No answer, left a VM message to call office.

## 2019-04-06 NOTE — Telephone Encounter (Signed)
Thanks

## 2019-04-07 DIAGNOSIS — Z96612 Presence of left artificial shoulder joint: Secondary | ICD-10-CM | POA: Diagnosis not present

## 2019-04-07 DIAGNOSIS — M25612 Stiffness of left shoulder, not elsewhere classified: Secondary | ICD-10-CM | POA: Diagnosis not present

## 2019-04-08 ENCOUNTER — Encounter: Payer: Self-pay | Admitting: Family Medicine

## 2019-04-14 ENCOUNTER — Ambulatory Visit: Payer: Medicare Other | Attending: Internal Medicine

## 2019-04-14 DIAGNOSIS — Z23 Encounter for immunization: Secondary | ICD-10-CM | POA: Insufficient documentation

## 2019-04-14 DIAGNOSIS — Z96612 Presence of left artificial shoulder joint: Secondary | ICD-10-CM | POA: Diagnosis not present

## 2019-04-14 DIAGNOSIS — M25612 Stiffness of left shoulder, not elsewhere classified: Secondary | ICD-10-CM | POA: Diagnosis not present

## 2019-04-14 NOTE — Progress Notes (Signed)
   Covid-19 Vaccination Clinic  Name:  Amner Knoedler    MRN: [id number] DOB: Sep 20, 1950  04/14/2019  Mr. Rabel was observed post Covid-19 immunization for 15 minutes without incidence. He was provided with Vaccine Information Sheet and instruction to access the V-Safe system.   Mr. Scheidecker was instructed to call 911 with any severe reactions post vaccine: Marland Kitchen Difficulty breathing  . Swelling of your face and throat  . A fast heartbeat  . A bad rash all over your body  . Dizziness and weakness    Immunizations Administered    Name Date Dose VIS Date Route   Pfizer COVID-19 Vaccine 04/14/2019 11:31 AM 0.3 mL 03/05/2019 Intramuscular   Manufacturer: [place]   Lot: [id number]   [place]: [id number]

## 2019-04-15 DIAGNOSIS — Z96612 Presence of left artificial shoulder joint: Secondary | ICD-10-CM | POA: Diagnosis not present

## 2019-04-15 DIAGNOSIS — M25612 Stiffness of left shoulder, not elsewhere classified: Secondary | ICD-10-CM | POA: Diagnosis not present

## 2019-04-19 DIAGNOSIS — Z96612 Presence of left artificial shoulder joint: Secondary | ICD-10-CM | POA: Diagnosis not present

## 2019-04-19 DIAGNOSIS — M25612 Stiffness of left shoulder, not elsewhere classified: Secondary | ICD-10-CM | POA: Diagnosis not present

## 2019-04-21 DIAGNOSIS — M25612 Stiffness of left shoulder, not elsewhere classified: Secondary | ICD-10-CM | POA: Diagnosis not present

## 2019-04-21 DIAGNOSIS — Z96612 Presence of left artificial shoulder joint: Secondary | ICD-10-CM | POA: Diagnosis not present

## 2019-04-27 DIAGNOSIS — Z96612 Presence of left artificial shoulder joint: Secondary | ICD-10-CM | POA: Diagnosis not present

## 2019-04-27 DIAGNOSIS — M25612 Stiffness of left shoulder, not elsewhere classified: Secondary | ICD-10-CM | POA: Diagnosis not present

## 2019-04-28 DIAGNOSIS — M25612 Stiffness of left shoulder, not elsewhere classified: Secondary | ICD-10-CM | POA: Diagnosis not present

## 2019-04-28 DIAGNOSIS — Z96612 Presence of left artificial shoulder joint: Secondary | ICD-10-CM | POA: Diagnosis not present

## 2019-04-28 DIAGNOSIS — R05 Cough: Secondary | ICD-10-CM | POA: Diagnosis not present

## 2019-05-03 DIAGNOSIS — Z96612 Presence of left artificial shoulder joint: Secondary | ICD-10-CM | POA: Diagnosis not present

## 2019-05-03 DIAGNOSIS — M25612 Stiffness of left shoulder, not elsewhere classified: Secondary | ICD-10-CM | POA: Diagnosis not present

## 2019-05-03 DIAGNOSIS — M19012 Primary osteoarthritis, left shoulder: Secondary | ICD-10-CM | POA: Diagnosis not present

## 2019-05-05 ENCOUNTER — Ambulatory Visit: Payer: Medicare HMO | Attending: Internal Medicine

## 2019-05-05 DIAGNOSIS — Z23 Encounter for immunization: Secondary | ICD-10-CM | POA: Insufficient documentation

## 2019-05-05 DIAGNOSIS — Z96612 Presence of left artificial shoulder joint: Secondary | ICD-10-CM | POA: Diagnosis not present

## 2019-05-05 DIAGNOSIS — M25612 Stiffness of left shoulder, not elsewhere classified: Secondary | ICD-10-CM | POA: Diagnosis not present

## 2019-05-05 NOTE — Progress Notes (Signed)
   Covid-19 Vaccination Clinic  Name:  Dalton Delacruz    MRN: [id number] DOB: March 27, 1950  05/05/2019  Dalton Delacruz was observed post Covid-19 immunization for 15 minutes without incidence. He was provided with Vaccine Information Sheet and instruction to access the V-Safe system.   Dalton Delacruz was instructed to call 911 with any severe reactions post vaccine: Marland Kitchen Difficulty breathing  . Swelling of your face and throat  . A fast heartbeat  . A bad rash all over your body  . Dizziness and weakness    Immunizations Administered    Name Date Dose VIS Date Route   Pfizer COVID-19 Vaccine 05/05/2019  9:23 AM 0.3 mL 03/05/2019 Intramuscular   Manufacturer: [place]   Lot: [id number]   [place]: [id number]

## 2019-05-10 DIAGNOSIS — Z96612 Presence of left artificial shoulder joint: Secondary | ICD-10-CM | POA: Diagnosis not present

## 2019-05-10 DIAGNOSIS — M25612 Stiffness of left shoulder, not elsewhere classified: Secondary | ICD-10-CM | POA: Diagnosis not present

## 2019-05-11 DIAGNOSIS — R69 Illness, unspecified: Secondary | ICD-10-CM | POA: Diagnosis not present

## 2019-05-12 DIAGNOSIS — Z96612 Presence of left artificial shoulder joint: Secondary | ICD-10-CM | POA: Diagnosis not present

## 2019-05-12 DIAGNOSIS — M25612 Stiffness of left shoulder, not elsewhere classified: Secondary | ICD-10-CM | POA: Diagnosis not present

## 2019-05-17 DIAGNOSIS — M25612 Stiffness of left shoulder, not elsewhere classified: Secondary | ICD-10-CM | POA: Diagnosis not present

## 2019-05-17 DIAGNOSIS — Z96612 Presence of left artificial shoulder joint: Secondary | ICD-10-CM | POA: Diagnosis not present

## 2019-05-19 DIAGNOSIS — Z96612 Presence of left artificial shoulder joint: Secondary | ICD-10-CM | POA: Diagnosis not present

## 2019-05-19 DIAGNOSIS — M25612 Stiffness of left shoulder, not elsewhere classified: Secondary | ICD-10-CM | POA: Diagnosis not present

## 2019-09-23 DIAGNOSIS — D225 Melanocytic nevi of trunk: Secondary | ICD-10-CM | POA: Diagnosis not present

## 2019-09-23 DIAGNOSIS — L814 Other melanin hyperpigmentation: Secondary | ICD-10-CM | POA: Diagnosis not present

## 2019-09-23 DIAGNOSIS — L905 Scar conditions and fibrosis of skin: Secondary | ICD-10-CM | POA: Diagnosis not present

## 2019-10-28 DIAGNOSIS — R05 Cough: Secondary | ICD-10-CM | POA: Diagnosis not present

## 2019-11-15 DIAGNOSIS — R69 Illness, unspecified: Secondary | ICD-10-CM | POA: Diagnosis not present

## 2019-12-06 ENCOUNTER — Other Ambulatory Visit: Payer: Self-pay | Admitting: Family Medicine

## 2019-12-12 ENCOUNTER — Encounter: Payer: Self-pay | Admitting: Family Medicine

## 2019-12-30 DIAGNOSIS — R69 Illness, unspecified: Secondary | ICD-10-CM | POA: Diagnosis not present

## 2020-02-28 ENCOUNTER — Other Ambulatory Visit: Payer: Self-pay | Admitting: Family Medicine

## 2020-03-05 ENCOUNTER — Other Ambulatory Visit: Payer: Self-pay | Admitting: Family Medicine

## 2020-03-05 DIAGNOSIS — E785 Hyperlipidemia, unspecified: Secondary | ICD-10-CM

## 2020-03-05 DIAGNOSIS — Z125 Encounter for screening for malignant neoplasm of prostate: Secondary | ICD-10-CM

## 2020-03-14 ENCOUNTER — Ambulatory Visit (INDEPENDENT_AMBULATORY_CARE_PROVIDER_SITE_OTHER): Payer: Medicare HMO

## 2020-03-14 ENCOUNTER — Other Ambulatory Visit (INDEPENDENT_AMBULATORY_CARE_PROVIDER_SITE_OTHER): Payer: Medicare HMO

## 2020-03-14 ENCOUNTER — Other Ambulatory Visit: Payer: Self-pay

## 2020-03-14 DIAGNOSIS — Z0000 Encounter for general adult medical examination without abnormal findings: Secondary | ICD-10-CM | POA: Diagnosis not present

## 2020-03-14 DIAGNOSIS — E785 Hyperlipidemia, unspecified: Secondary | ICD-10-CM

## 2020-03-14 DIAGNOSIS — Z125 Encounter for screening for malignant neoplasm of prostate: Secondary | ICD-10-CM | POA: Diagnosis not present

## 2020-03-14 LAB — COMPREHENSIVE METABOLIC PANEL
ALT: 15 U/L (ref 0–53)
AST: 16 U/L (ref 0–37)
Albumin: 4.1 g/dL (ref 3.5–5.2)
Alkaline Phosphatase: 68 U/L (ref 39–117)
BUN: 20 mg/dL (ref 6–23)
CO2: 30 mEq/L (ref 19–32)
Calcium: 9.1 mg/dL (ref 8.4–10.5)
Chloride: 107 mEq/L (ref 96–112)
Creatinine, Ser: 1.13 mg/dL (ref 0.40–1.50)
GFR: 66.81 mL/min (ref >60.00)
Glucose, Bld: 90 mg/dL (ref 70–99)
Potassium: 4.7 mEq/L (ref 3.5–5.1)
Sodium: 141 mEq/L (ref 135–145)
Total Bilirubin: 0.6 mg/dL (ref 0.2–1.2)
Total Protein: 6.6 g/dL (ref 6.0–8.3)

## 2020-03-14 LAB — LIPID PANEL
Cholesterol: 185 mg/dL (ref 0–200)
HDL: 49.9 mg/dL (ref >39.00)
LDL Cholesterol: 125 mg/dL — ABNORMAL HIGH (ref 0–99)
NonHDL: 135.13
Total CHOL/HDL Ratio: 4
Triglycerides: 52 mg/dL (ref 0.0–149.0)
VLDL: 10.4 mg/dL (ref 0.0–40.0)

## 2020-03-14 LAB — PSA, MEDICARE: PSA: 2.94 ng/ml (ref 0.10–4.00)

## 2020-03-14 NOTE — Patient Instructions (Signed)
[name] , Thank you for taking time to come for your Medicare Wellness Visit. I appreciate your ongoing commitment to your health goals. Please review the following plan we discussed and let me know if I can assist you in the future.   Screening recommendations/referrals: Colonoscopy: Up to date, completed 12/15/2018, due 11/2025 Recommended yearly ophthalmology/optometry visit for glaucoma screening and checkup Recommended yearly dental visit for hygiene and checkup  Vaccinations: Influenza vaccine: due, will discuss with provider at visit  Pneumococcal vaccine: due, will get at upcoming physical  Tdap vaccine: decline-insurance Shingles vaccine: Completed series Covid-19: Completed series  Advanced directives: copy in chart  Conditions/risks identified: hypertension  Next appointment: Follow up in one year for your annual wellness visit.   Preventive Care 71 Years and Older, Male Preventive care refers to lifestyle choices and visits with your health care provider that can promote health and wellness. What does preventive care include?  A yearly physical exam. This is also called an annual well check.  Dental exams once or twice a year.  Routine eye exams. Ask your health care provider how often you should have your eyes checked.  Personal lifestyle choices, including:  Daily care of your teeth and gums.  Regular physical activity.  Eating a healthy diet.  Avoiding tobacco and drug use.  Limiting alcohol use.  Practicing safe sex.  Taking low doses of aspirin every day.  Taking vitamin and mineral supplements as recommended by your health care provider. What happens during an annual well check? The services and screenings done by your health care provider during your annual well check will depend on your age, overall health, lifestyle risk factors, and family history of disease. Counseling  Your health care provider may ask you questions about your:  Alcohol  use.  Tobacco use.  Drug use.  Emotional well-being.  Home and relationship well-being.  Sexual activity.  Eating habits.  History of falls.  Memory and ability to understand (cognition).  Work and work Statistician. Screening  You may have the following tests or measurements:  Height, weight, and BMI.  Blood pressure.  Lipid and cholesterol levels. These may be checked every 5 years, or more frequently if you are over 21 years old.  Skin check.  Lung cancer screening. You may have this screening every year starting at age 52 if you have a 30-pack-year history of smoking and currently smoke or have quit within the past 15 years.  Fecal occult blood test (FOBT) of the stool. You may have this test every year starting at age 46.  Flexible sigmoidoscopy or colonoscopy. You may have a sigmoidoscopy every 5 years or a colonoscopy every 10 years starting at age 80.  Prostate cancer screening. Recommendations will vary depending on your family history and other risks.  Hepatitis C blood test.  Hepatitis B blood test.  Sexually transmitted disease (STD) testing.  Diabetes screening. This is done by checking your blood sugar (glucose) after you have not eaten for a while (fasting). You may have this done every 1-3 years.  Abdominal aortic aneurysm (AAA) screening. You may need this if you are a current or former smoker.  Osteoporosis. You may be screened starting at age 24 if you are at high risk. Talk with your health care provider about your test results, treatment options, and if necessary, the need for more tests. Vaccines  Your health care provider may recommend certain vaccines, such as:  Influenza vaccine. This is recommended every year.  Tetanus, diphtheria,  and acellular pertussis (Tdap, Td) vaccine. You may need a Td booster every 10 years.  Zoster vaccine. You may need this after age 34.  Pneumococcal 13-valent conjugate (PCV13) vaccine. One dose is  recommended after age 61.  Pneumococcal polysaccharide (PPSV23) vaccine. One dose is recommended after age 37. Talk to your health care provider about which screenings and vaccines you need and how often you need them. This information is not intended to replace advice given to you by your health care provider. Make sure you discuss any questions you have with your health care provider. Document Released: 04/07/2015 Document Revised: 11/29/2015 Document Reviewed: 01/10/2015 Elsevier Interactive Patient Education  2017 [place] Prevention in the Home Falls can cause injuries. They can happen to people of all ages. There are many things you can do to make your home safe and to help prevent falls. What can I do on the outside of my home?  Regularly fix the edges of walkways and driveways and fix any cracks.  Remove anything that might make you trip as you walk through a door, such as a raised step or threshold.  Trim any bushes or trees on the path to your home.  Use bright outdoor lighting.  Clear any walking paths of anything that might make someone trip, such as rocks or tools.  Regularly check to see if handrails are loose or broken. Make sure that both sides of any steps have handrails.  Any raised decks and porches should have guardrails on the edges.  Have any leaves, snow, or ice cleared regularly.  Use sand or salt on walking paths during winter.  Clean up any spills in your garage right away. This includes oil or grease spills. What can I do in the bathroom?  Use night lights.  Install grab bars by the toilet and in the tub and shower. Do not use towel bars as grab bars.  Use non-skid mats or decals in the tub or shower.  If you need to sit down in the shower, use a plastic, non-slip stool.  Keep the floor dry. Clean up any water that spills on the floor as soon as it happens.  Remove soap buildup in the tub or shower regularly.  Attach bath mats  securely with double-sided non-slip rug tape.  Do not have throw rugs and other things on the floor that can make you trip. What can I do in the bedroom?  Use night lights.  Make sure that you have a light by your bed that is easy to reach.  Do not use any sheets or blankets that are too big for your bed. They should not hang down onto the floor.  Have a firm chair that has side arms. You can use this for support while you get dressed.  Do not have throw rugs and other things on the floor that can make you trip. What can I do in the kitchen?  Clean up any spills right away.  Avoid walking on wet floors.  Keep items that you use a lot in easy-to-reach places.  If you need to reach something above you, use a strong step stool that has a grab bar.  Keep electrical cords out of the way.  Do not use floor polish or wax that makes floors slippery. If you must use wax, use non-skid floor wax.  Do not have throw rugs and other things on the floor that can make you trip. What can I do with my  stairs?  Do not leave any items on the stairs.  Make sure that there are handrails on both sides of the stairs and use them. Fix handrails that are broken or loose. Make sure that handrails are as long as the stairways.  Check any carpeting to make sure that it is firmly attached to the stairs. Fix any carpet that is loose or worn.  Avoid having throw rugs at the top or bottom of the stairs. If you do have throw rugs, attach them to the floor with carpet tape.  Make sure that you have a light switch at the top of the stairs and the bottom of the stairs. If you do not have them, ask someone to add them for you. What else can I do to help prevent falls?  Wear shoes that:  Do not have high heels.  Have rubber bottoms.  Are comfortable and fit you well.  Are closed at the toe. Do not wear sandals.  If you use a stepladder:  Make sure that it is fully opened. Do not climb a closed  stepladder.  Make sure that both sides of the stepladder are locked into place.  Ask someone to hold it for you, if possible.  Clearly mark and make sure that you can see:  Any grab bars or handrails.  First and last steps.  Where the edge of each step is.  Use tools that help you move around (mobility aids) if they are needed. These include:  Canes.  Walkers.  Scooters.  Crutches.  Turn on the lights when you go into a dark area. Replace any light bulbs as soon as they burn out.  Set up your furniture so you have a clear path. Avoid moving your furniture around.  If any of your floors are uneven, fix them.  If there are any pets around you, be aware of where they are.  Review your medicines with your doctor. Some medicines can make you feel dizzy. This can increase your chance of falling. Ask your doctor what other things that you can do to help prevent falls. This information is not intended to replace advice given to you by your health care provider. Make sure you discuss any questions you have with your health care provider. Document Released: 01/05/2009 Document Revised: 08/17/2015 Document Reviewed: 04/15/2014 Elsevier Interactive Patient Education  2017 [employer].

## 2020-03-14 NOTE — Progress Notes (Addendum)
Subjective:   Dalton Delacruz is a 69 y.o. male who presents for Medicare Annual/Subsequent preventive examination.  Review of Systems: N/A      I connected with the patient today by telephone and verified that I am speaking with the correct person using two identifiers. Location patient: home Location nurse: work Persons participating in the telephone visit: patient, nurse.   I discussed the limitations, risks, security and privacy concerns of performing an evaluation and management service by telephone and the availability of in person appointments. I also discussed with the patient that there may be a patient responsible charge related to this service. The patient expressed understanding and verbally consented to this telephonic visit.        Cardiac Risk Factors include: advanced age (>98men, >64 women);male gender;hypertension     Objective:    Today's Vitals   There is no height or weight on file to calculate BMI.  Advanced Directives 03/14/2020 02/04/2019 12/15/2018  Does Patient Have a Medical Advance Directive? Yes Yes Yes  Type of Paramedic of [place];Living will [place];Living will Living will  Does patient want to make changes to medical advance directive? - No - Patient declined -  Copy of [place] in Chart? Yes - validated most recent copy scanned in chart (See row information) Yes - validated most recent copy scanned in chart (See row information) -  Would patient like information on creating a medical advance directive? - No - Patient declined -    Current Medications (verified) Outpatient Encounter Medications as of 03/14/2020  Medication Sig  . aspirin EC 81 MG tablet Take 1 tablet (81 mg total) by mouth daily.  Marland Kitchen gabapentin (NEURONTIN) 100 MG capsule PLEASE SEE ATTACHED FOR DETAILED DIRECTIONS  . tamsulosin (FLOMAX) 0.4 MG CAPS capsule TAKE 1 CAPSULE BY MOUTH EVERY DAY MUST HAVE APPT FOR  REFILLS  . Zoster Vaccine Adjuvanted [facility]) injection Inject 0.5 mLs into the muscle as directed. 2nd dose 2 months after 1st dose  . omeprazole (PRILOSEC) 40 MG capsule Take 1 capsule (40 mg total) by mouth 2 (two) times daily.  Marland Kitchen oxyCODONE-acetaminophen (PERCOCET) 5-325 MG tablet Take 1-2 tablets every 4 hours as needed for post operative pain. MAX 6/day  . tiZANidine (ZANAFLEX) 4 MG tablet Take 1 tablet (4 mg total) by mouth every 8 (eight) hours as needed for muscle spasms.  . Xylitol (XYLIMELTS) 550 MG DISK Use as directed 2 tablets in the mouth or throat Nightly. With an extra dose in the day if needed. (Patient taking differently: Use as directed 2 tablets in the mouth or throat at bedtime. )   No facility-administered encounter medications on file as of 03/14/2020.    Allergies (verified) Lisinopril   History: Past Medical History:  Diagnosis Date  . Displaced spiral fracture of shaft of left humerus ~ 2002  . GERD (gastroesophageal reflux disease)    with LPR  . Hypertension    Past Surgical History:  Procedure Laterality Date  . CATARACT EXTRACTION Bilateral    2015  . COLONOSCOPY     2010  . COLONOSCOPY WITH PROPOFOL N/A 12/15/2018   Procedure: COLONOSCOPY WITH PROPOFOL;  Surgeon: [name], MD;  Location: [facility] ENDOSCOPY;  Service: Gastroenterology;  Laterality: N/A;  . ESOPHAGOGASTRODUODENOSCOPY (EGD) WITH PROPOFOL N/A 12/15/2018   Procedure: ESOPHAGOGASTRODUODENOSCOPY (EGD) WITH PROPOFOL;  Surgeon: [name], MD;  Location: [facility] ENDOSCOPY;  Service: Gastroenterology;  Laterality: N/A;  . FISTULOTOMY  03/04/13  . 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 Cancer Mother        Breast  . Diabetes Mother   . Hyperlipidemia Mother   . Hypertension Mother   .  Heart disease Mother   . Ulcers Father        stomach ulcers  . Heart disease Father   . Diabetes Brother   . Prostate cancer Neg Hx   . Colon cancer Neg Hx    Social History   Socioeconomic History  . Marital status: Married    Spouse name: Not on file  . Number of children: 3  . Years of education: Not on file  . Highest education level: Not on file  Occupational History  . Occupation: VP     Comment: [employer]  Tobacco Use  . Smoking status: Former Smoker    Packs/day: 1.00    Years: 5.00    Pack years: 5.00    Types: Cigars    Quit date: 03/25/2010    Years since quitting: 9.9  . Smokeless tobacco: Never Used  . Tobacco comment: occasional cigar   Vaping Use  . Vaping Use: Never used  Substance and Sexual Activity  . Alcohol use: Yes    Alcohol/week: 0.0 standard drinks    Comment: A few drinks a week  . Drug use: No  . Sexual activity: Not on file  Other Topics Concern  . 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     Married 1975, 3 kids, 5 grandkids   Social Determinants of Radio broadcast assistant Strain: Low Risk   . Difficulty of Paying Living Expenses: Not hard at all  Food Insecurity: No Food Insecurity  . Worried About Charity fundraiser in the Last Year: Never true  . Ran Out of Food in the Last Year: Never true  Transportation Needs: No Transportation Needs  . Lack of Transportation (Medical): No  . Lack of Transportation (Non-Medical): No  Physical Activity: Sufficiently Active  . Days of Exercise per Week: 4 days  . Minutes of Exercise per Session: 60 min  Stress: No Stress Concern Present  . Feeling of Stress : Not at all  Social  Connections: Not on file    Tobacco Counseling Counseling given: Not Answered Comment: occasional cigar    Clinical Intake:  Pre-visit preparation completed: Yes  Pain : No/denies pain     Nutritional Risks: None Diabetes: No  How often do you need to have someone help you when you read instructions, pamphlets, or other written materials from your doctor or pharmacy?: 1 - Never What is the last grade level you completed in school?: some graduate courses  Diabetic: No Nutrition Risk Assessment:  Has the patient had any N/V/D within the last 2 months?  No  Does the patient have any non-healing wounds?  No  Has the patient had any unintentional weight loss or weight gain?  No   Diabetes:  Is the patient diabetic?  No  If diabetic, was a CBG obtained today?  N/A Did the patient bring in their glucometer from home?  N/A How often do you monitor your CBG's? N/A.   Financial Strains and Diabetes Management:  Are you having any financial strains with the device, your supplies or your medication? N/A.  Does the patient want to be seen by Chronic Care Management for management of their diabetes?  N/A Would the patient like to be referred to a Nutritionist or for Diabetic Management?  N/A   Interpreter Needed?: No  Information entered by :: Cjohnson, LPN   Activities of Daily Living In your present state of health, do you have any difficulty performing the following activities: 03/14/2020  Hearing? N  Vision? N  Difficulty concentrating or making decisions? N  Walking or climbing stairs? N  Dressing or bathing? N  Doing errands, shopping? N  Preparing Food and eating ? N  Using the Toilet? N  In the past six months, have you accidently leaked urine? Y  Comment occasionally  Do you have problems with loss of bowel control? N  Managing your Medications? N  Managing your Finances? N  Housekeeping or managing your Housekeeping? N  Some recent data might be hidden     Patient Care Team: Tonia Ghent, MD as PCP - General Byrnett, Forest Gleason, MD (General Surgery) Tonia Ghent, MD as Consulting Physician (Family Medicine)  Indicate any recent Medical Services you may have received from other than [facility] providers in the past year (date may be approximate).     Assessment:   This is a routine wellness examination for Kim.  Hearing/Vision screen No exam data present  Dietary issues and exercise activities discussed: Current Exercise Habits: Home exercise routine, Type of exercise: walking, Time (Minutes): 60, Frequency (Times/Week): 4, Weekly Exercise (Minutes/Week): 240, Intensity: Moderate, Exercise limited by: None identified  Goals    . Patient Stated     03/14/2020, I will continue to walk 3-4 times a week for about 1-2 hours.       Depression Screen PHQ 2/9 Scores 03/14/2020 09/03/2018 06/03/2017  PHQ - 2 Score 0 0 0  PHQ- 9 Score 0 - -    Fall Risk Fall Risk  03/14/2020 09/03/2018 06/03/2017  Falls in the past year? 0 0 No  Number falls in past yr: 0 - -  Injury with Fall? 0 - -  Risk for fall due to : No Fall Risks - -  Follow up Falls evaluation completed;Falls prevention discussed - -    FALL RISK PREVENTION PERTAINING TO THE HOME:  Any stairs in or around the home? Yes  If so, are there any without handrails? No  Home free of loose throw rugs in walkways, pet beds, electrical cords, etc? Yes  Adequate lighting in your home to reduce risk of falls? Yes   ASSISTIVE DEVICES UTILIZED TO PREVENT FALLS:  Life alert? No  Use of a cane, walker or w/c? No  Grab bars in the bathroom? No  Shower chair or bench in shower? No  Elevated toilet seat or a handicapped toilet? No   TIMED UP AND GO:  Was the test performed? N/A, telephone visit.   Cognitive Function: MMSE - Mini Mental State Exam 03/14/2020  Orientation to time 5  Orientation to Place 5  Registration 3  Attention/ Calculation 5  Recall 3  Language-  repeat 1       Mini Cog  Mini-Cog  screen was completed. Maximum score is 22. A value of 0 denotes this part of the MMSE was not completed or the patient failed this part of the Mini-Cog screening.  Immunizations Immunization History  Administered Date(s) Administered  . Fluad Quad(high Dose 65+) 11/19/2018  . Influenza, High Dose Seasonal PF 01/29/2018  . Influenza,inj,Quad PF,6+ Mos 05/10/2016  . Influenza-Unspecified 12/30/2013, 01/24/2015, 01/29/2018  . PFIZER SARS-COV-2 Vaccination 04/14/2019, 05/05/2019, 12/30/2019  . Pneumococcal Conjugate-13 06/03/2017  . Td 03/26/2007  . Zoster 06/24/2014  . Zoster Recombinat (Shingrix) 05/05/2019, 07/07/2019    TDAP status: Due, Education has been provided regarding the importance of this vaccine. Advised may receive this vaccine at local pharmacy or Health Dept. Aware to provide a copy of the vaccination record if obtained from local pharmacy or Health Dept. Verbalized acceptance and understanding.  Flu Vaccine status: due, will discuss with provider at visit  Pneumococcal vaccine status: Due, Education has been provided regarding the importance of this vaccine. Advised may receive this vaccine at local pharmacy or Health Dept. Aware to provide a copy of the vaccination record if obtained from local pharmacy or Health Dept. Verbalized acceptance and understanding.  Covid-19 vaccine status: Completed vaccines  Qualifies for Shingles Vaccine? Yes   Zostavax completed Yes   Shingrix Completed?: Yes  Screening Tests Health Maintenance  Topic Date Due  . PNA vac Low Risk Adult (2 of 2 - PPSV23) 06/04/2018  . INFLUENZA VACCINE  10/24/2019  . TETANUS/TDAP  03/14/2024 (Originally 03/25/2017)  . COLONOSCOPY  12/14/2025  . COVID-19 Vaccine  Completed  . Hepatitis C Screening  Completed    Health Maintenance  Health Maintenance Due  Topic Date Due  . PNA vac Low Risk Adult (2 of 2 - PPSV23) 06/04/2018  . INFLUENZA VACCINE  10/24/2019     Colorectal cancer screening: Type of screening: Colonoscopy. Completed 12/15/2018. Repeat every 7 years  Lung Cancer Screening: (Low Dose CT Chest recommended if Age 1-80 years, 30 pack-year currently smoking OR have quit w/in 15 years.) does not qualify.   Additional Screening:  Hepatitis C Screening: does qualify; Completed 05/06/2016  Vision Screening: Recommended annual ophthalmology exams for early detection of glaucoma and other disorders of the eye. Is the patient up to date with their annual eye exam?  Yes  Who is the provider or what is the name of the office in which the patient attends annual eye exams? Dr. Gershon Crane If pt is not established with a provider, would they like to be referred to a provider to establish care? No .   Dental Screening: Recommended annual dental exams for proper oral hygiene  Community Resource Referral / Chronic Care Management: CRR required this visit?  No   CCM required this visit?  No      Plan:     I have personally reviewed and noted the following in the patient's chart:   . Medical and social history . Use of alcohol, tobacco or illicit drugs  . Current medications and supplements . Functional ability and status . Nutritional status . Physical activity . Advanced directives . List of other physicians . Hospitalizations, surgeries, and ER visits in previous 12 months . Vitals . Screenings to include cognitive, depression, and falls . Referrals and appointments  In addition, I have reviewed and discussed with patient certain preventive protocols, quality metrics, and best practice recommendations. A written personalized care plan for preventive services as well as general preventive health recommendations were provided to patient.   Due to this  being a telephonic visit, the after visit summary with patients personalized plan was offered to patient via office or my-chart.  Patient preferred to pick up at office at next visit or via  mychart.   Andrez Grime, LPN   [id number]   I reviewed health advisor's note, was available for consultation on the day of service listed in this note, and agree with documentation and plan. Elsie Stain, MD.

## 2020-03-14 NOTE — Progress Notes (Signed)
PCP notes:  Health Maintenance: Flu- due, Patient said he received this at the office at some point this season but it is not documented in his chart  Pneumococcal 23- due Tdap- insurance   Abnormal Screenings: none   Patient concerns: Urinary frequency 2-3 times at night    Nurse concerns: none   Next PCP appt.: 03/30/2020 @ 12 pm

## 2020-03-16 ENCOUNTER — Encounter: Payer: Medicare HMO | Admitting: Family Medicine

## 2020-03-25 ENCOUNTER — Encounter: Payer: Self-pay | Admitting: Family Medicine

## 2020-03-26 ENCOUNTER — Encounter: Payer: Self-pay | Admitting: Family Medicine

## 2020-03-26 ENCOUNTER — Encounter (HOSPITAL_COMMUNITY): Payer: Self-pay

## 2020-03-26 ENCOUNTER — Ambulatory Visit (HOSPITAL_COMMUNITY)
Admission: RE | Admit: 2020-03-26 | Discharge: 2020-03-26 | Disposition: A | Discharge status: Home / Self Care | Payer: Medicare HMO | Source: Ambulatory Visit | Attending: Emergency Medicine | Admitting: Emergency Medicine

## 2020-03-26 ENCOUNTER — Other Ambulatory Visit: Payer: Self-pay

## 2020-03-26 VITALS — BP 148/89 | HR 78 | Temp 98.2°F | Resp 18

## 2020-03-26 DIAGNOSIS — J069 Acute upper respiratory infection, unspecified: Secondary | ICD-10-CM | POA: Diagnosis not present

## 2020-03-26 DIAGNOSIS — U071 COVID-19: Secondary | ICD-10-CM | POA: Insufficient documentation

## 2020-03-26 DIAGNOSIS — J029 Acute pharyngitis, unspecified: Secondary | ICD-10-CM | POA: Diagnosis not present

## 2020-03-26 DIAGNOSIS — R6883 Chills (without fever): Secondary | ICD-10-CM

## 2020-03-26 LAB — POCT RAPID STREP A, ED / UC: Streptococcus, Group A Screen (Direct): NEGATIVE

## 2020-03-26 MED ORDER — PROMETHAZINE-DM 6.25-15 MG/5ML PO SYRP: 5.0000 mL | ORAL_SOLUTION | Freq: Four times a day (QID) | ORAL | 0 refills | Status: AC | PRN

## 2020-03-26 MED ORDER — BENZONATATE 100 MG PO CAPS: 200.0000 mg | ORAL_CAPSULE | Freq: Three times a day (TID) | ORAL | 0 refills | Status: DC

## 2020-03-26 NOTE — ED Provider Notes (Signed)
[facility]    CSN: [id number] Arrival date & time: 03/26/20  1244      History   Chief Complaint Chief Complaint  Patient presents with  . Appointment    1300  . Sore Throat  . Chills         HPI Dalton Delacruz is a 70 y.o. male.   HPI   70 year old male here for evaluation of sore throat, nasal congestion, and chills.  Patient reports that he has had symptoms for the last 3 days.  Patient did take a home Covid test which was negative.  Patient has been vaccinating his Covid and had his posterior as well as his flu shot.  Patient reports that he has had a runny nose and a productive cough for clear sputum.  Patient denies fever, ear pain or pressure, shortness of breath or wheezing, GI complaints, or sick contacts.  Patient also denies any changes to his sense of taste or smell.  Past Medical History:  Diagnosis Date  . Displaced spiral fracture of shaft of left humerus ~ 2002  . GERD (gastroesophageal reflux disease)    with LPR  . Hypertension     Patient Active Problem List   Diagnosis Date Noted  . 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 Dysphagia   . Colon cancer screening   . Polyp of colon   . Left shoulder pain 09/06/2018  . Lower urinary tract symptoms (LUTS) 02/10/2014  . Advance care planning 02/10/2014  . Cough 02/03/2012  . Hypertension 04/21/2011  . Syncope 03/10/2011  . Medicare annual wellness visit, subsequent 06/26/2010  . Family history of diabetes mellitus (DM) 06/18/2010    Past Surgical History:  Procedure Laterality Date  . CATARACT EXTRACTION Bilateral    2015  . COLONOSCOPY     2010  . COLONOSCOPY WITH PROPOFOL N/A 12/15/2018   Procedure: COLONOSCOPY WITH PROPOFOL;  Surgeon: Wyline Mood, MD;  Location: [facility] ENDOSCOPY;  Service: Gastroenterology;  Laterality: N/A;  . ESOPHAGOGASTRODUODENOSCOPY (EGD) WITH PROPOFOL N/A 12/15/2018   Procedure: ESOPHAGOGASTRODUODENOSCOPY (EGD) WITH PROPOFOL;  Surgeon:  Wyline Mood, MD;  Location: [facility] ENDOSCOPY;  Service: Gastroenterology;  Laterality: N/A;  . FISTULOTOMY  03/04/13  . TOTAL SHOULDER ARTHROPLASTY Left 02/04/2019   Procedure: TOTAL SHOULDER ARTHROPLASTY;  Surgeon: Jones Broom, MD;  Location: [facility] ORS;  Service: Orthopedics;  Laterality: Left;  Marland Kitchen VASECTOMY         Home Medications    Prior to Admission medications   Medication Sig Start Date End Date Taking? Authorizing Provider  benzonatate (TESSALON) 100 MG capsule Take 2 capsules (200 mg total) by mouth every 8 (eight) hours. 03/26/20  Yes Becky Augusta, NP  promethazine-dextromethorphan (PROMETHAZINE-DM) 6.25-15 MG/5ML syrup Take 5 mLs by mouth 4 (four) times daily as needed for cough. 03/26/20  Yes Becky Augusta, NP  aspirin EC 81 MG tablet Take 1 tablet (81 mg total) by mouth daily. 03/20/11   Laurey Morale, MD  gabapentin (NEURONTIN) 100 MG capsule PLEASE SEE ATTACHED FOR DETAILED DIRECTIONS 02/24/19   [provider]  omeprazole (PRILOSEC) 40 MG capsule Take 1 capsule (40 mg total) by mouth 2 (two) times daily. 01/21/19   Joaquim Nam, MD  oxyCODONE-acetaminophen (PERCOCET) 5-325 MG tablet Take 1-2 tablets every 4 hours as needed for post operative pain. MAX 6/day 02/05/19   Jiles Harold, PA-C  tamsulosin (FLOMAX) 0.4 MG CAPS capsule TAKE 1 CAPSULE BY MOUTH EVERY DAY MUST HAVE APPT FOR REFILLS 02/28/20   Para March,  Elveria Rising, MD  tiZANidine (ZANAFLEX) 4 MG tablet Take 1 tablet (4 mg total) by mouth every 8 (eight) hours as needed for muscle spasms. 02/05/19   Grier Mitts, PA-C  Xylitol (XYLIMELTS) 550 MG DISK Use as directed 2 tablets in the mouth or throat Nightly. With an extra dose in the day if needed. Patient taking differently: Use as directed 2 tablets in the mouth or throat at bedtime.  01/21/19   Tonia Ghent, MD  Zoster Vaccine Adjuvanted [facility]) injection Inject 0.5 mLs into the muscle as directed. 2nd dose 2 months after 1st dose 04/06/19    Tonia Ghent, MD    Family History Family History  Problem Relation Age of Onset  . Cancer Mother        Breast  . Diabetes Mother   . Hyperlipidemia Mother   . Hypertension Mother   . Heart disease Mother   . Ulcers Father        stomach ulcers  . Heart disease Father   . Diabetes Brother   . Prostate cancer Neg Hx   . Colon cancer Neg Hx     Social History Social History   Tobacco Use  . Smoking status: Former Smoker    Packs/day: 1.00    Years: 5.00    Pack years: 5.00    Types: Cigars    Quit date: 03/25/2010    Years since quitting: 10.0  . Smokeless tobacco: Never Used  . Tobacco comment: occasional cigar   Vaping Use  . Vaping Use: Never used  Substance Use Topics  . Alcohol use: Yes    Alcohol/week: 0.0 standard drinks    Comment: A few drinks a week  . Drug use: No     Allergies   Lisinopril   Review of Systems Review of Systems  Constitutional: Positive for chills. Negative for activity change, appetite change and fever.  HENT: Positive for congestion, rhinorrhea and sore throat. Negative for ear discharge.   Respiratory: Positive for cough. Negative for shortness of breath and wheezing.   Gastrointestinal: Negative for diarrhea, nausea and vomiting.  Skin: Negative for rash.  Hematological: Negative.   Psychiatric/Behavioral: Negative.      Physical Exam Triage Vital Signs ED Triage Vitals [03/26/20 1334]  Enc Vitals Group     BP (!) 148/89     Pulse Rate 78     Resp 18     Temp 98.2 F (36.8 C)     Temp Source Oral     SpO2 95 %     Weight      Height      Head Circumference      Peak Flow      Pain Score      Pain Loc      Pain Edu?      Excl. in [place]?    No data found.  Updated Vital Signs BP (!) 148/89 (BP Location: Right Arm)   Pulse 78   Temp 98.2 F (36.8 C) (Oral)   Resp 18   SpO2 95%   Visual Acuity Right Eye Distance:   Left Eye Distance:   Bilateral Distance:    Right Eye Near:   Left Eye Near:     Bilateral Near:     Physical Exam Vitals and nursing note reviewed.  Constitutional:      General: He is not in acute distress.    Appearance: He is well-developed. He is not toxic-appearing.  HENT:  Head: Normocephalic and atraumatic.     Right Ear: Tympanic membrane and ear canal normal. Tympanic membrane is not erythematous.     Left Ear: Tympanic membrane and ear canal normal. Tympanic membrane is not erythematous.     Nose: Congestion and rhinorrhea present.     Comments: Nasal mucosa is mildly erythematous and edematous with scant clear nasal discharge.    Mouth/Throat:     Mouth: Mucous membranes are moist.     Pharynx: Oropharynx is clear. Posterior oropharyngeal erythema present. No oropharyngeal exudate.     Tonsils: No tonsillar exudate. 0 on the right. 0 on the left.  Cardiovascular:     Rate and Rhythm: Normal rate and regular rhythm.     Heart sounds: Normal heart sounds. No murmur heard. No gallop.   Pulmonary:     Effort: Pulmonary effort is normal.     Breath sounds: Normal breath sounds. No wheezing, rhonchi or rales.  Musculoskeletal:     Cervical back: Normal range of motion and neck supple.  Lymphadenopathy:     Cervical: No cervical adenopathy.  Skin:    General: Skin is warm and dry.     Capillary Refill: Capillary refill takes less than 2 seconds.     Findings: No erythema or rash.  Neurological:     General: No focal deficit present.     Mental Status: He is alert and oriented to person, place, and time.  Psychiatric:        Mood and Affect: Mood normal.        Behavior: Behavior normal.      UC Treatments / Results  Labs (all labs ordered are listed, but only abnormal results are displayed) Labs Reviewed  SARS CORONAVIRUS 2 (TAT 6-24 HRS)  POCT RAPID STREP A, ED / UC    EKG   Radiology No results found.  Procedures Procedures (including critical care time)  Medications Ordered in UC Medications - No data to display  Initial  Impression / Assessment and Plan / UC Course  I have reviewed the triage vital signs and the nursing notes.  Pertinent labs & imaging results that were available during my care of the patient were reviewed by me and considered in my medical decision making (see chart for details).   Patient is here for evaluation of a sore throat and associated cold symptoms.  Patient is fully vaccinated with his booster and had a negative Covid test at home.  He denies any sick contacts.  Physical exam reveals erythematous edematous nasal mucosa with some clear nasal discharge and erythematous post anterior oropharynx.  Tonsillar pillars are unremarkable and there is no exudate present.  No cervical lymphadenopathy present.  Lungs are clear to auscultation all fields.  Suspect patient symptoms are URI in nature, possibly Covid.  Will send Covid screening swab and also check rapid strep in clinic.  Rapid strep is negative and Covid swab is still pending.  Will discharge patient home with diagnosis of URI and have him isolate until the results of Covid swab are back.  Will give patient Tessalon Perles and Promethazine DM for cough, have him use ibuprofen and Tylenol for sore throat pain, Sucrets lozenges, warm salt water gargles, and isolate until results of his Covid test are back.  Final Clinical Impressions(s) / UC Diagnoses   Final diagnoses:  Upper respiratory tract infection, unspecified type     Discharge Instructions     Your rapid strep test was negative.  Isolate at home  until the results of your Covid test are back.  If you are positive then you will need to quarantine for 10 days from your symptoms started.  After the 10 days you can break quarantine if your symptoms have improved and you have not had a fever in 24 hours without taking Tylenol or ibuprofen.  Use Tylenol and ibuprofen over-the-counter as needed for pain and aches.  Use the Tessalon Perles during the day as needed for cough and the  Promethazine DM cough syrup at nighttime.  The cough syrup will make you drowsy.  If you develop any shortness of breath, especially shortness of breath at rest, you were unable to speak full sentences, or you develop bluing around your lips you to go to the ER for evaluation.    ED Prescriptions    Medication Sig Dispense Auth. Provider   benzonatate (TESSALON) 100 MG capsule Take 2 capsules (200 mg total) by mouth every 8 (eight) hours. 21 capsule Margarette Canada, NP   promethazine-dextromethorphan (PROMETHAZINE-DM) 6.25-15 MG/5ML syrup Take 5 mLs by mouth 4 (four) times daily as needed for cough. 118 mL Margarette Canada, NP     PDMP not reviewed this encounter.   Margarette Canada, NP 03/26/20 [phone]

## 2020-03-26 NOTE — Discharge Instructions (Addendum)
Your rapid strep test was negative.  Isolate at home until the results of your Covid test are back.  If you are positive then you will need to quarantine for 10 days from your symptoms started.  After the 10 days you can break quarantine if your symptoms have improved and you have not had a fever in 24 hours without taking Tylenol or ibuprofen.  Use Tylenol and ibuprofen over-the-counter as needed for pain and aches.  Use the Tessalon Perles during the day as needed for cough and the Promethazine DM cough syrup at nighttime.  The cough syrup will make you drowsy.  If you develop any shortness of breath, especially shortness of breath at rest, you were unable to speak full sentences, or you develop bluing around your lips you to go to the ER for evaluation.

## 2020-03-26 NOTE — ED Triage Notes (Signed)
Pt presents with sore throat, nasal congestion and chills x 3 days. Soiree throat worse when swallows. Denies fever, sob. Negative COVID test at home.

## 2020-03-27 ENCOUNTER — Telehealth: Payer: Self-pay

## 2020-03-27 LAB — SARS CORONAVIRUS 2 (TAT 6-24 HRS): SARS Coronavirus 2: POSITIVE — AB

## 2020-03-27 NOTE — Telephone Encounter (Signed)
[facility] Night - Client TELEPHONE ADVICE RECORD AccessNurse Patient Name: Dalton Delacruz Gender: Male DOB: [birth year] Age: 71 Y 5 M 23 D Return Phone Number: [phone] (Primary), [phone] (Secondary) Address[place] [address] Client [facility] Night - Client Client Site  [facility] - Night Physician Raechel Ache - MD Contact Type Call Who Is Calling Patient / Member / Family / Caregiver Call Type Triage / Clinical Caller Name Jeremie Giangrande Relationship To Patient Spouse Return Phone Number [phone] (Primary) Chief Complaint Muscle Jerks, Tics And Shudders Reason for Call Symptomatic / Request for Health Information Initial Comment Caller states her husband had a home COVID test. He is cold and shaking today. His Tylenol is helping lower the temperature to 99.6. The shaking only started today. Tehir son is a doctor adn told them to call becuase he is concerned about a bacterial infection. Translation No Nurse Assessment Nurse: Stefano Gaul, RN, Dwana Curd Date/Time (Eastern Time): 03/24/2020 2:36:37 PM Confirm and document reason for call. If symptomatic, describe symptoms. ---Caller states spouse had positive home COVID test. Fever and runny nose started on Tuesday. Has a lot of nasal congestion. Whole body was shaking and he had tylenol. temp 99..4. cough. Does the patient have any new or worsening symptoms? ---Yes Will a triage be completed? ---Yes Related visit to physician within the last 2 weeks? ---No Does the PT have any chronic conditions? (i.e. diabetes, asthma, this includes High risk factors for pregnancy, etc.) ---No Is this a behavioral health or substance abuse call? ---No Guidelines Guideline Title Affirmed Question Affirmed Notes Nurse Date/Time (Eastern Time) COVID-19 - Diagnosed or Suspected HIGH RISK for severe COVID complications (e.g., age > 64 years, obesity  with BMI > 25, pregnant, chronic lung disease or other chronic medical condition) (Exception: Already seen by PCP and no new or worsening symptoms.) Stefano Gaul, RN, Dwana Curd 03/24/2020 2:40:51 PM PLEASE NOTE: All timestamps contained within this report are represented as [place] Standard Time. CONFIDENTIALTY NOTICE: This fax transmission is intended only for the addressee. It contains information that is legally privileged, confidential or otherwise protected from use or disclosure. If you are not the intended recipient, you are strictly prohibited from reviewing, disclosing, copying using or disseminating any of this information or taking any action in reliance on or regarding this information. If you have received this fax in error, please notify us immediately by telephone so that we can arrange for its return to [place]. Phone: [phone], Toll-Free: [phone], Fax: [phone] Page: 2 of 2 Call Id: [id number] Disp. Time Lamount Cohen Time) Disposition Final User 03/24/2020 2:49:37 PM Go to ED Now (or PCP triage) Yes Stefano Gaul, RN, Dwana Curd Disposition Overriden: Call PCP Now Override Reason: Patient's symptoms need a higher level of care Caller Disagree/Comply Comply Caller Understands Yes PreDisposition Did not know what to do Care Advice Given Per Guideline * IF NO PCP (PRIMARY CARE PROVIDER) SECOND-LEVEL TRIAGE: You need to be seen within the next hour. Go to the ED/UCC at _____________ Hospital. Leave as soon as you can. GO TO ED NOW (OR PCP TRIAGE): Comments User: Art Buff, RN Date/Time Lamount Cohen Time): 03/24/2020 2:49:34 PM Triage outcome upgraded to see go to ER (or PCP triage) as pt was shaking over a hr. Referrals GO TO FACILITY UNDECIDED  Patient went to UC on 03/26/2020. Please see note. COVID-19 positive. Patient states that he still has a cough and sore throat, but overall he is feeling better. Patient denies: fever, chills, SOB, N/V/D,  and other related symptoms. Instructed  patient to continue to quarantine and drink plenty of fluids. Instructed patient if he had new or worsening symptoms to report to UC or ED. Patient verbalized understanding.

## 2020-03-28 ENCOUNTER — Telehealth: Payer: Self-pay | Admitting: Family Medicine

## 2020-03-28 NOTE — Telephone Encounter (Signed)
Noted.  Agreed.  Thanks. 

## 2020-03-28 NOTE — Telephone Encounter (Signed)
Please call patient and move his appointment on 03/30/20 back.  He needs to be rescheduled for a later date.  Thanks.

## 2020-03-29 LAB — AEROBIC CULTURE W GRAM STAIN (SUPERFICIAL SPECIMEN)
Culture: NORMAL
Gram Stain: NONE SEEN
Special Requests: NORMAL

## 2020-03-29 MED ORDER — TAMSULOSIN HCL 0.4 MG PO CAPS: ORAL_CAPSULE | ORAL | 1 refills | Status: DC

## 2020-03-29 NOTE — Telephone Encounter (Signed)
Sent. Thanks.   

## 2020-03-29 NOTE — Telephone Encounter (Signed)
Pt called in wanted to know about getting the Tamsulosin refilled they only gave him a week worth until he talk to Dr.Duncan but the appointment was pushed back. And wanted to know about getting another one

## 2020-03-30 ENCOUNTER — Encounter: Payer: Medicare HMO | Admitting: Family Medicine

## 2020-04-03 ENCOUNTER — Encounter: Payer: Self-pay | Admitting: Family Medicine

## 2020-04-04 ENCOUNTER — Other Ambulatory Visit: Payer: Self-pay | Admitting: Family Medicine

## 2020-04-04 MED ORDER — BENZONATATE 200 MG PO CAPS: 200.0000 mg | ORAL_CAPSULE | Freq: Three times a day (TID) | ORAL | 2 refills | Status: DC | PRN

## 2020-04-11 ENCOUNTER — Other Ambulatory Visit: Payer: Self-pay

## 2020-04-11 ENCOUNTER — Ambulatory Visit (INDEPENDENT_AMBULATORY_CARE_PROVIDER_SITE_OTHER): Payer: Medicare HMO | Admitting: Family Medicine

## 2020-04-11 ENCOUNTER — Encounter: Payer: Self-pay | Admitting: Family Medicine

## 2020-04-11 VITALS — BP 148/78 | HR 76 | Temp 98.2°F | Ht 71.0 in | Wt 210.0 lb

## 2020-04-11 DIAGNOSIS — Z0000 Encounter for general adult medical examination without abnormal findings: Secondary | ICD-10-CM

## 2020-04-11 DIAGNOSIS — R399 Unspecified symptoms and signs involving the genitourinary system: Secondary | ICD-10-CM | POA: Diagnosis not present

## 2020-04-11 DIAGNOSIS — R059 Cough, unspecified: Secondary | ICD-10-CM

## 2020-04-11 DIAGNOSIS — Z7189 Other specified counseling: Secondary | ICD-10-CM

## 2020-04-11 MED ORDER — BENZONATATE 200 MG PO CAPS: 200.0000 mg | ORAL_CAPSULE | Freq: Three times a day (TID) | ORAL | 2 refills | Status: AC | PRN

## 2020-04-11 MED ORDER — TAMSULOSIN HCL 0.4 MG PO CAPS: ORAL_CAPSULE | ORAL | 1 refills | Status: DC

## 2020-04-11 NOTE — Patient Instructions (Signed)
PNA 23 when possible.  Pneumovax.    Recheck your BP when possible.  Goal BP below 140/below 90.   Update me as needed.    Can try 0-2 tabs of flomax.  Update me as needed.   Take care.  Glad to see you.

## 2020-04-11 NOTE — Progress Notes (Signed)
This visit occurred during the SARS-CoV-2 public health emergency.  Safety protocols were in place, including screening questions prior to the visit, additional usage of staff PPE, and extensive cleaning of exam room while observing appropriate contact time as indicated for disinfecting solutions.   Separate issue discussed with patient.  He may be moving to [place], [place] later this year.  If so, we can transfer his records upon his request.  I have always enjoyed seeing this kind gentleman here in clinic and I will miss the opportunity to see him in the future if he does move to [place].  I wish him and his family only the best.      I have personally reviewed the Medicare Annual Wellness questionnaire and have noted 1. The patient's medical and social history 2. Their use of alcohol, tobacco or illicit drugs 3. Their current medications and supplements 4. The patient's functional ability including ADL's, fall risks, home safety risks and hearing or visual             impairment. 5. Diet and physical activities 6. Evidence for depression or mood disorders  The patients weight, height, BMI have been recorded in the chart and visual acuity is per eye clinic.  I have made referrals, counseling and provided education to the patient based review of the above and I have provided the pt with a written personalized care plan for preventive services.  Provider list updated- see scanned forms.  Routine anticipatory guidance given to patient.  See health maintenance. The possibility exists that previously documented standard health maintenance information may have been brought forward from a previous encounter into this note.  If needed, that same information has been updated to reflect the current situation based on today's encounter.    Flu up-to-date Shingles up-to-date  PNA deferred given recent COVID infection. Tetanus 2009, discussed with patient COVID vaccine up-to-date. Colon  cancer screening up-to-date with colonoscopy 2020 Prostate cancer screening-PSA normal 02/2020 Advance directive-wife designated if patient were incapacitated. Cognitive function addressed- see scanned forms- and if abnormal then additional documentation follows.   LUTS discussed with patient.  Unclear how much flomax is helping.  No burning with urination.  Some occ frequency.  Nocturia x2, bothersome.  We talked about options.  He is going to stop flomax and see if that shows a change.  He'll update me as needed.  He can also try 2 tabs a day if needed.  He'll update me.  PSA wnl.    Still on gabapentin per outside clinic.  Taking 1-2 tabs of 100mg  gabapentin per day.  Used with relief.  No ADE on med.    Minimal cough after covid, he is clearly better.  Tessalon helps the cough.    He will recheck his blood pressure out of clinic and update me if persistently elevated.  See after visit summary.  PMH and SH reviewed  Meds, vitals, and allergies reviewed.   ROS: Per HPI.  Unless specifically indicated otherwise in HPI, the patient denies:  General: fever. Eyes: acute vision changes ENT: sore throat Cardiovascular: chest pain Respiratory: SOB GI: vomiting GU: dysuria Musculoskeletal: acute back pain Derm: acute rash Neuro: acute motor dysfunction Psych: worsening mood Endocrine: polydipsia Heme: bleeding Allergy: hayfever  GEN: nad, alert and oriented HEENT: ncat NECK: supple w/o LA CV: rrr. PULM: ctab, no inc wob ABD: soft, +bs EXT: no edema SKIN: no acute rash

## 2020-04-13 NOTE — Assessment & Plan Note (Signed)
Minimal cough at this point, likely with a postinfectious cough.  Lungs are clear.  Can use Tessalon and then update me as needed.  He agrees with plan.

## 2020-04-13 NOTE — Assessment & Plan Note (Signed)
Flu up-to-date Shingles up-to-date  PNA deferred given recent COVID infection. Tetanus 2009, discussed with patient COVID vaccine up-to-date. Colon cancer screening up-to-date with colonoscopy 2020 Prostate cancer screening-PSA normal 02/2020 Advance directive-wife designated if patient were incapacitated. Cognitive function addressed- see scanned forms- and if abnormal then additional documentation follows.

## 2020-04-13 NOTE — Assessment & Plan Note (Signed)
Advance directive- wife designated if patient were incapacitated.  

## 2020-04-13 NOTE — Assessment & Plan Note (Signed)
Unclear how much benefit he is getting from Flomax at this point.  We talked about options He is going to stop flomax and see if that shows a change.  He'll update me as needed.  He can also try 2 tabs a day if needed.  He'll update me.  PSA wnl.

## 2020-04-21 ENCOUNTER — Other Ambulatory Visit: Payer: Self-pay | Admitting: Family Medicine

## 2020-05-04 ENCOUNTER — Encounter: Payer: Self-pay | Admitting: Family Medicine

## 2020-05-04 ENCOUNTER — Other Ambulatory Visit: Payer: Self-pay

## 2020-05-04 MED ORDER — TAMSULOSIN HCL 0.4 MG PO CAPS: ORAL_CAPSULE | ORAL | 1 refills | Status: DC

## 2020-05-08 DIAGNOSIS — R059 Cough, unspecified: Secondary | ICD-10-CM | POA: Diagnosis not present

## 2020-05-16 ENCOUNTER — Other Ambulatory Visit: Payer: Self-pay

## 2020-05-16 MED ORDER — TAMSULOSIN HCL 0.4 MG PO CAPS: 0.4000 mg | ORAL_CAPSULE | Freq: Two times a day (BID) | ORAL | 0 refills | Status: DC

## 2020-05-30 ENCOUNTER — Other Ambulatory Visit: Payer: Self-pay | Admitting: Family Medicine

## 2020-06-26 ENCOUNTER — Other Ambulatory Visit: Payer: Self-pay | Admitting: Family Medicine

## 2020-09-11 DIAGNOSIS — Z872 Personal history of diseases of the skin and subcutaneous tissue: Secondary | ICD-10-CM | POA: Diagnosis not present

## 2020-09-11 DIAGNOSIS — L814 Other melanin hyperpigmentation: Secondary | ICD-10-CM | POA: Diagnosis not present

## 2020-09-11 DIAGNOSIS — L821 Other seborrheic keratosis: Secondary | ICD-10-CM | POA: Diagnosis not present

## 2020-09-11 DIAGNOSIS — D1801 Hemangioma of skin and subcutaneous tissue: Secondary | ICD-10-CM | POA: Diagnosis not present

## 2020-09-11 DIAGNOSIS — L905 Scar conditions and fibrosis of skin: Secondary | ICD-10-CM | POA: Diagnosis not present

## 2020-09-11 DIAGNOSIS — D225 Melanocytic nevi of trunk: Secondary | ICD-10-CM | POA: Diagnosis not present

## 2020-12-04 ENCOUNTER — Other Ambulatory Visit: Payer: Self-pay | Admitting: Family Medicine

## 2021-02-20 ENCOUNTER — Encounter: Payer: Self-pay | Admitting: Family Medicine

## 2021-03-07 DIAGNOSIS — Z125 Encounter for screening for malignant neoplasm of prostate: Secondary | ICD-10-CM | POA: Diagnosis not present

## 2021-03-07 DIAGNOSIS — Z130 Encounter for screening for diseases of the blood and blood-forming organs and certain disorders involving the immune mechanism: Secondary | ICD-10-CM | POA: Diagnosis not present

## 2021-03-07 DIAGNOSIS — R399 Unspecified symptoms and signs involving the genitourinary system: Secondary | ICD-10-CM | POA: Diagnosis not present

## 2021-03-07 DIAGNOSIS — Z13228 Encounter for screening for other metabolic disorders: Secondary | ICD-10-CM | POA: Diagnosis not present

## 2021-03-07 DIAGNOSIS — Z1329 Encounter for screening for other suspected endocrine disorder: Secondary | ICD-10-CM | POA: Diagnosis not present

## 2021-03-07 DIAGNOSIS — R053 Chronic cough: Secondary | ICD-10-CM | POA: Diagnosis not present

## 2021-03-07 DIAGNOSIS — Z7689 Persons encountering health services in other specified circumstances: Secondary | ICD-10-CM | POA: Diagnosis not present

## 2021-03-16 ENCOUNTER — Ambulatory Visit: Payer: Medicare HMO

## 2021-03-24 DIAGNOSIS — R69 Illness, unspecified: Secondary | ICD-10-CM | POA: Diagnosis not present

## 2021-05-28 DIAGNOSIS — R351 Nocturia: Secondary | ICD-10-CM | POA: Diagnosis not present

## 2021-05-28 DIAGNOSIS — I10 Essential (primary) hypertension: Secondary | ICD-10-CM | POA: Diagnosis not present

## 2021-05-28 DIAGNOSIS — Z125 Encounter for screening for malignant neoplasm of prostate: Secondary | ICD-10-CM | POA: Diagnosis not present

## 2021-05-28 DIAGNOSIS — R3915 Urgency of urination: Secondary | ICD-10-CM | POA: Diagnosis not present

## 2021-05-28 DIAGNOSIS — Z13228 Encounter for screening for other metabolic disorders: Secondary | ICD-10-CM | POA: Diagnosis not present

## 2021-05-28 DIAGNOSIS — N3943 Post-void dribbling: Secondary | ICD-10-CM | POA: Diagnosis not present

## 2021-05-28 DIAGNOSIS — R399 Unspecified symptoms and signs involving the genitourinary system: Secondary | ICD-10-CM | POA: Diagnosis not present

## 2021-05-28 DIAGNOSIS — Z130 Encounter for screening for diseases of the blood and blood-forming organs and certain disorders involving the immune mechanism: Secondary | ICD-10-CM | POA: Diagnosis not present

## 2021-05-28 DIAGNOSIS — Z1329 Encounter for screening for other suspected endocrine disorder: Secondary | ICD-10-CM | POA: Diagnosis not present

## 2021-05-30 DIAGNOSIS — Z23 Encounter for immunization: Secondary | ICD-10-CM | POA: Diagnosis not present

## 2021-05-30 DIAGNOSIS — Z0000 Encounter for general adult medical examination without abnormal findings: Secondary | ICD-10-CM | POA: Diagnosis not present

## 2021-05-30 DIAGNOSIS — I10 Essential (primary) hypertension: Secondary | ICD-10-CM | POA: Diagnosis not present

## 2021-05-30 DIAGNOSIS — I8392 Asymptomatic varicose veins of left lower extremity: Secondary | ICD-10-CM | POA: Diagnosis not present

## 2021-06-08 DIAGNOSIS — N401 Enlarged prostate with lower urinary tract symptoms: Secondary | ICD-10-CM | POA: Diagnosis not present

## 2021-06-08 DIAGNOSIS — R351 Nocturia: Secondary | ICD-10-CM | POA: Diagnosis not present

## 2021-06-08 DIAGNOSIS — N3943 Post-void dribbling: Secondary | ICD-10-CM | POA: Diagnosis not present

## 2021-06-08 DIAGNOSIS — R3915 Urgency of urination: Secondary | ICD-10-CM | POA: Diagnosis not present

## 2021-06-24 IMAGING — RF DG SWALLOWING FUNCTION
1 series · 4 of 4 positions shown · non-contrast
Comparison: None.

CLINICAL DATA: Dysphagia.

EXAM:
MODIFIED BARIUM SWALLOW
TECHNIQUE: Different consistencies of barium were administered orally to the
patient by the Speech Pathologist. Imaging of the pharynx was
performed in the lateral projection. The radiologist was present in
the fluoroscopy room for this study, providing personal supervision.
FLUOROSCOPY TIME:  Fluoroscopy Time:  0.5 minutes
Radiation Exposure Index (if provided by the fluoroscopic device):
1.7 mGy
Number of Acquired Spot Images: 0

[Series 1: cp_standard · 0.17mm/px · 4 of 105 frames shown]
[frame 16/105]
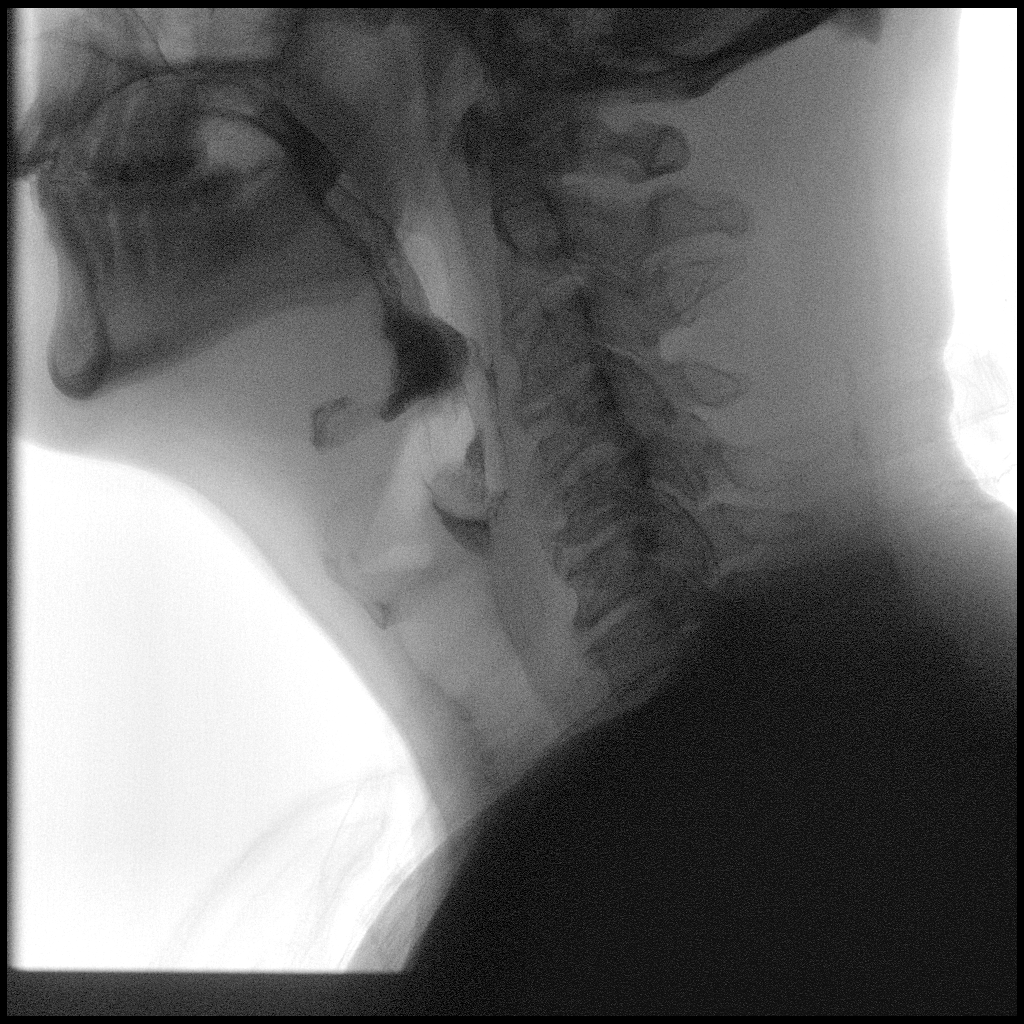
[frame 39/105]
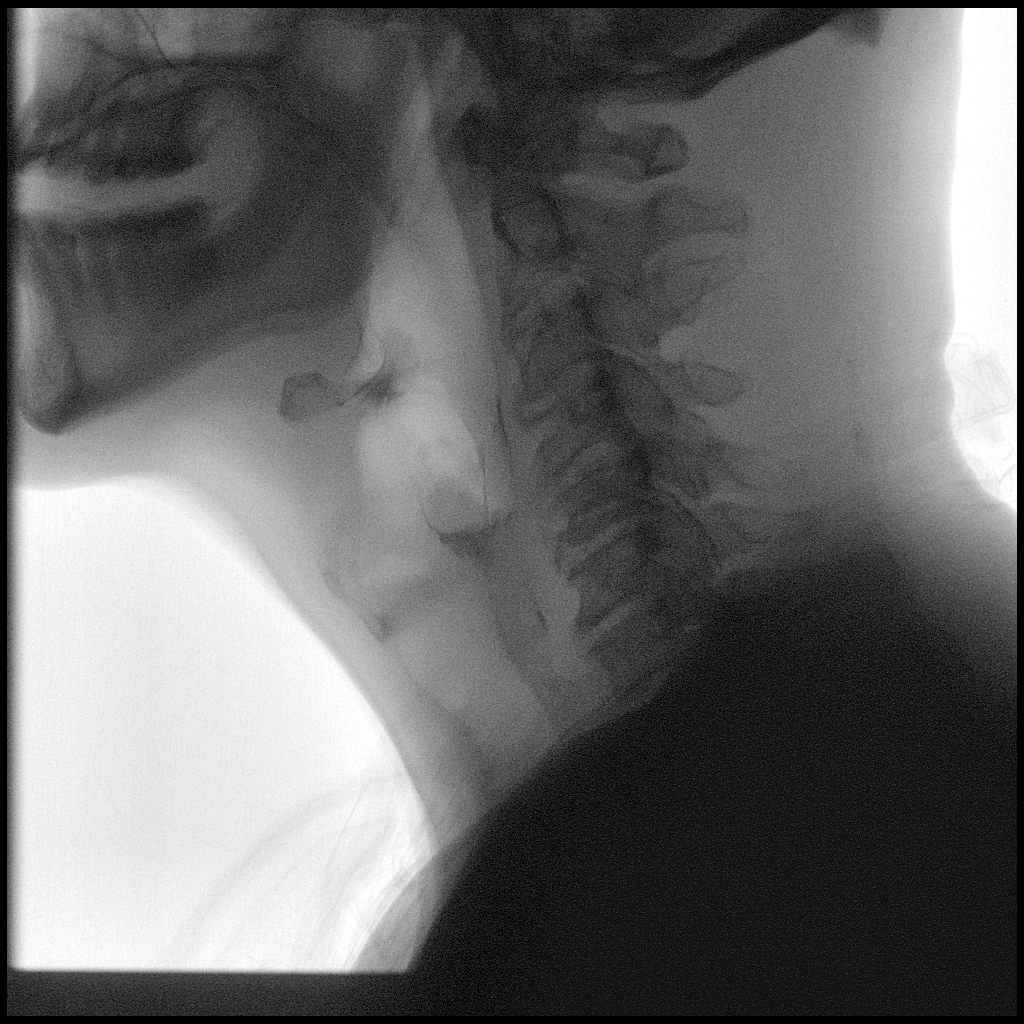
[frame 53/105]
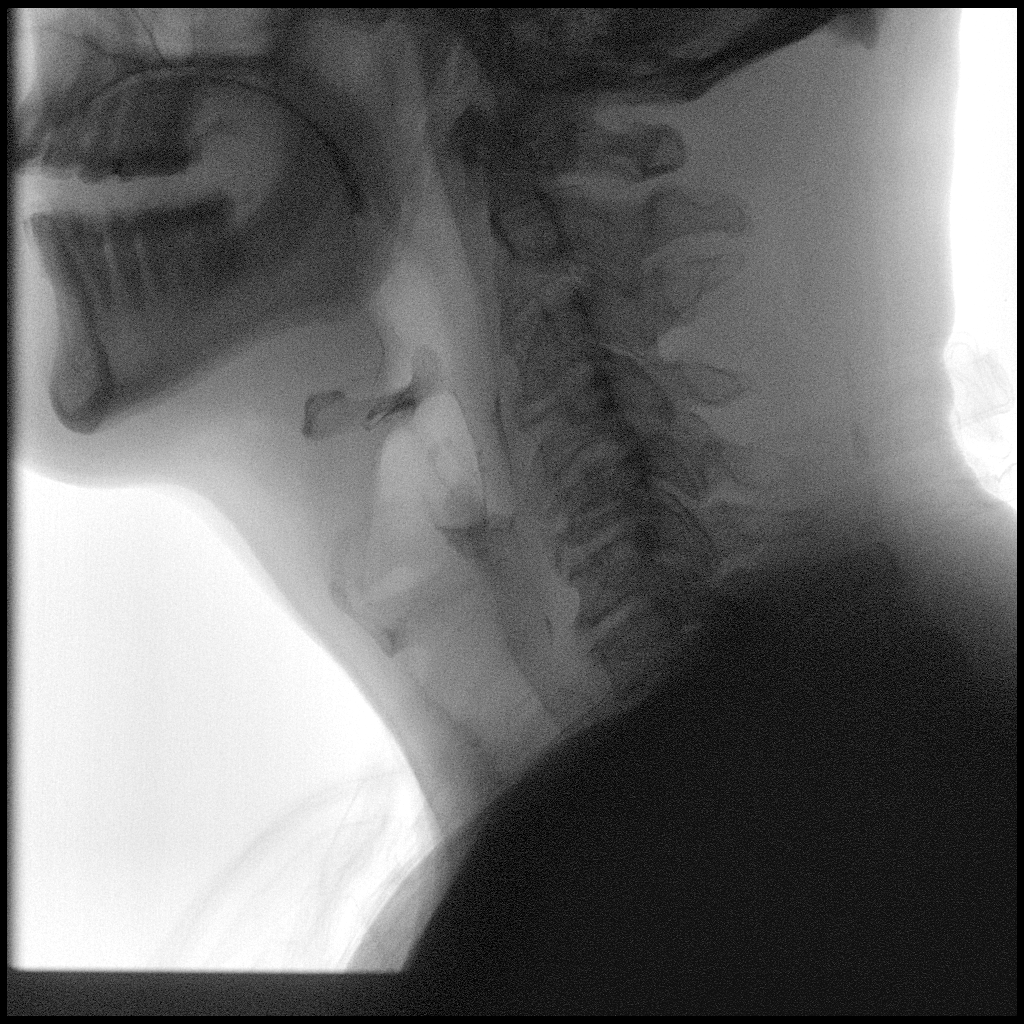
[frame 90/105]
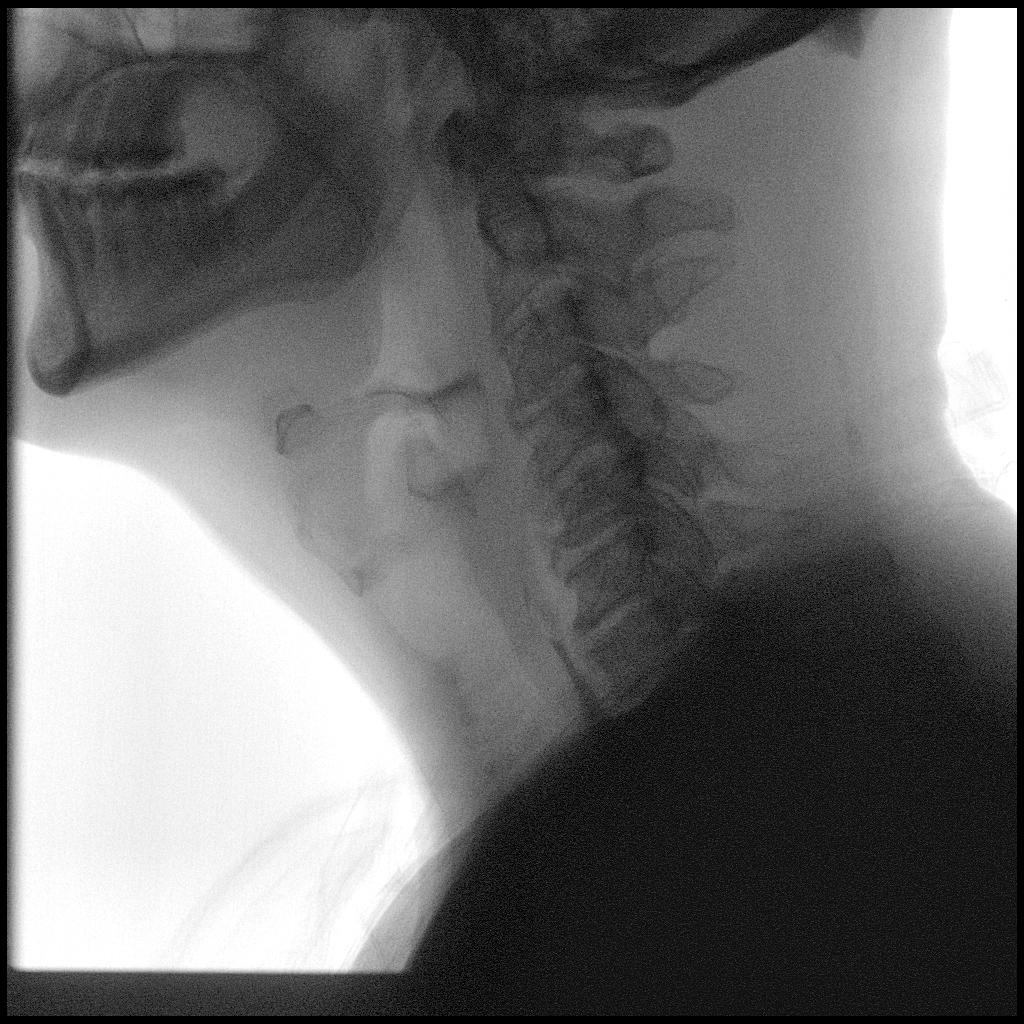

[4 of 4 positions shown; findings below may reference images not displayed]

FINDINGS: No pharyngeal penetration or aspiration was observed with varying
consistencies of barium.
IMPRESSION: No penetration or aspiration was observed.

Please refer to the Speech Pathologists report for complete details
and recommendations.

## 2021-06-27 IMAGING — DX DG CHEST 2V
2 series · 2 of 2 positions shown · non-contrast
Comparison: 09/03/2018

CLINICAL DATA: Pre operative respiratory exam. The patient is to
have left total shoulder arthroplasty.

EXAM:
CHEST - 2 VIEW

[chest pa]
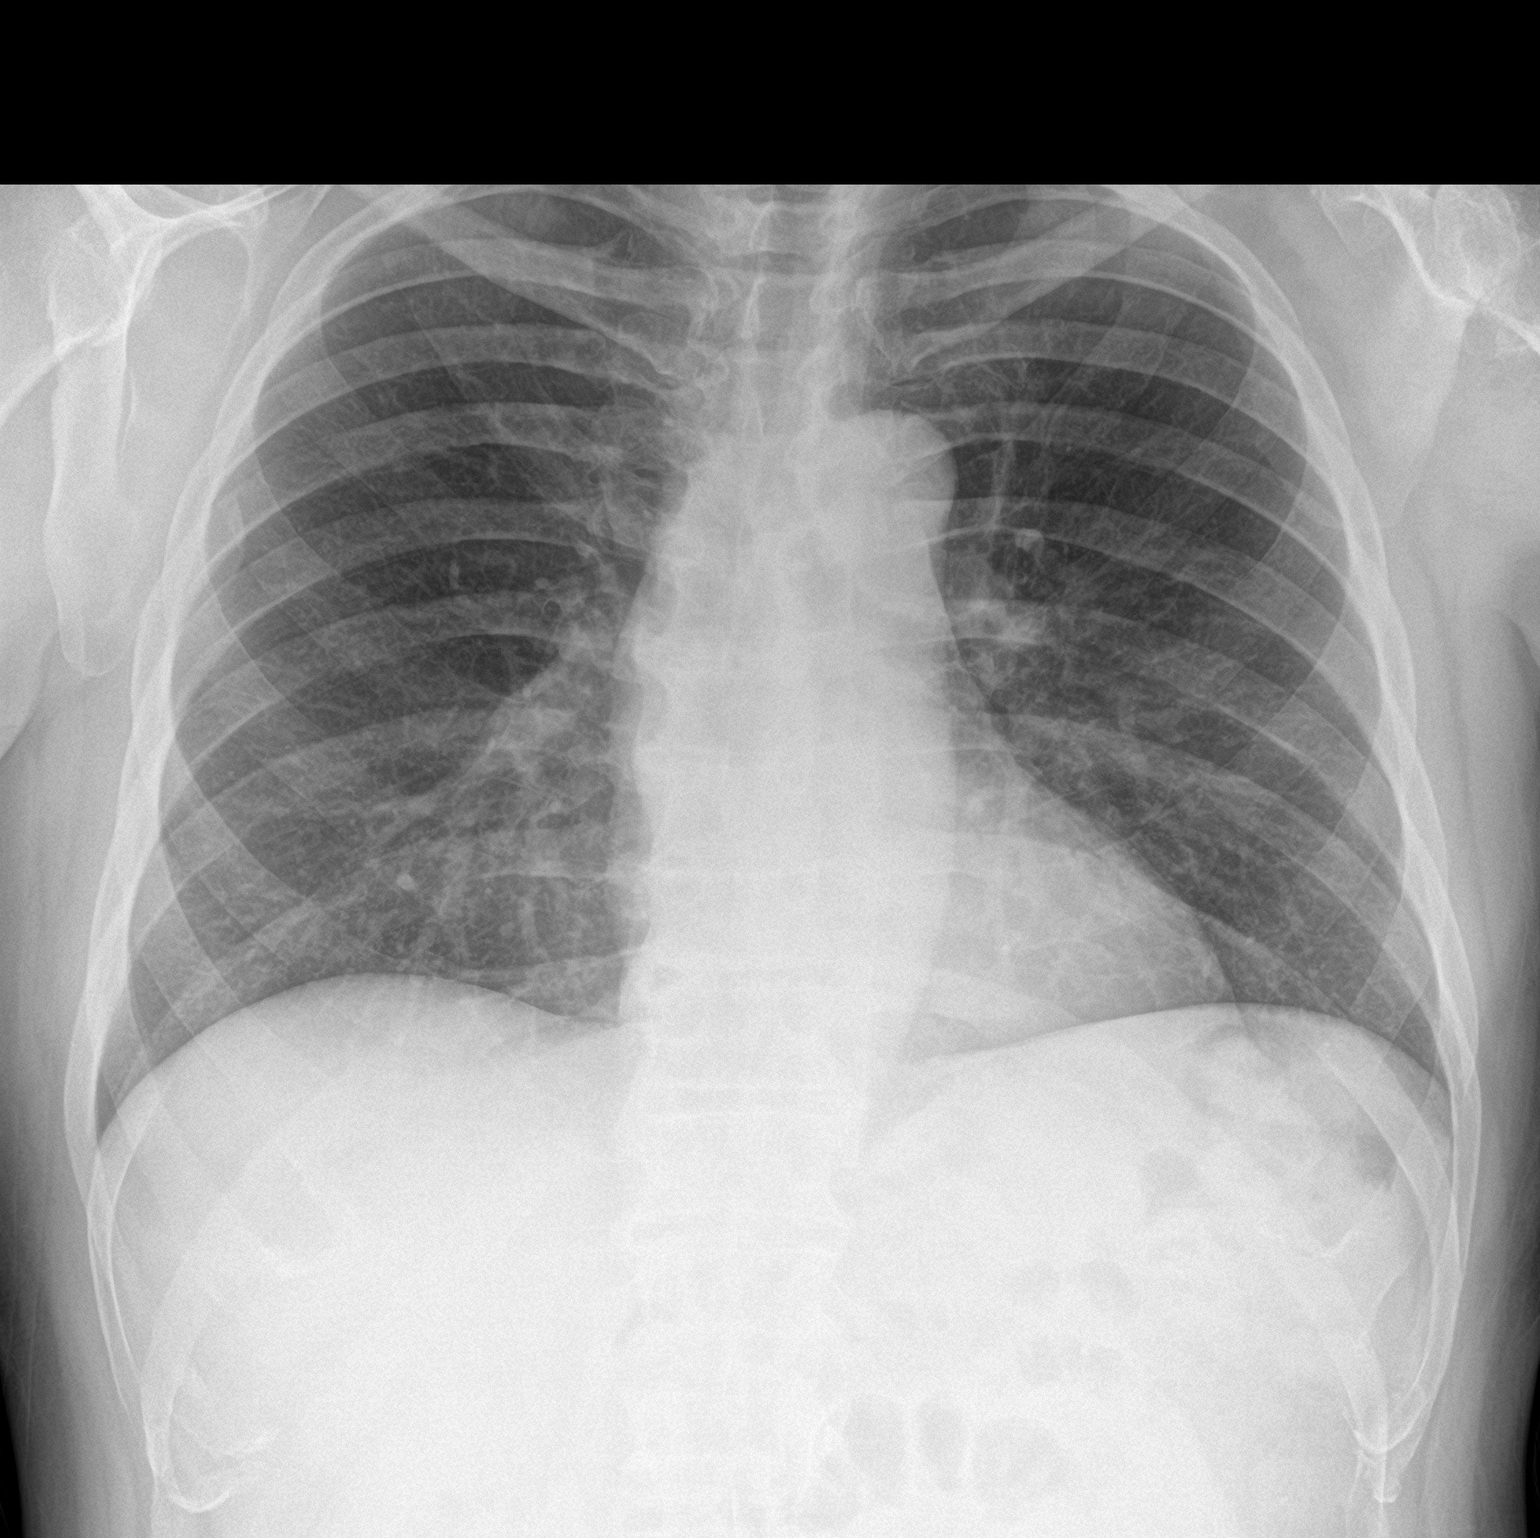

[chest lat]
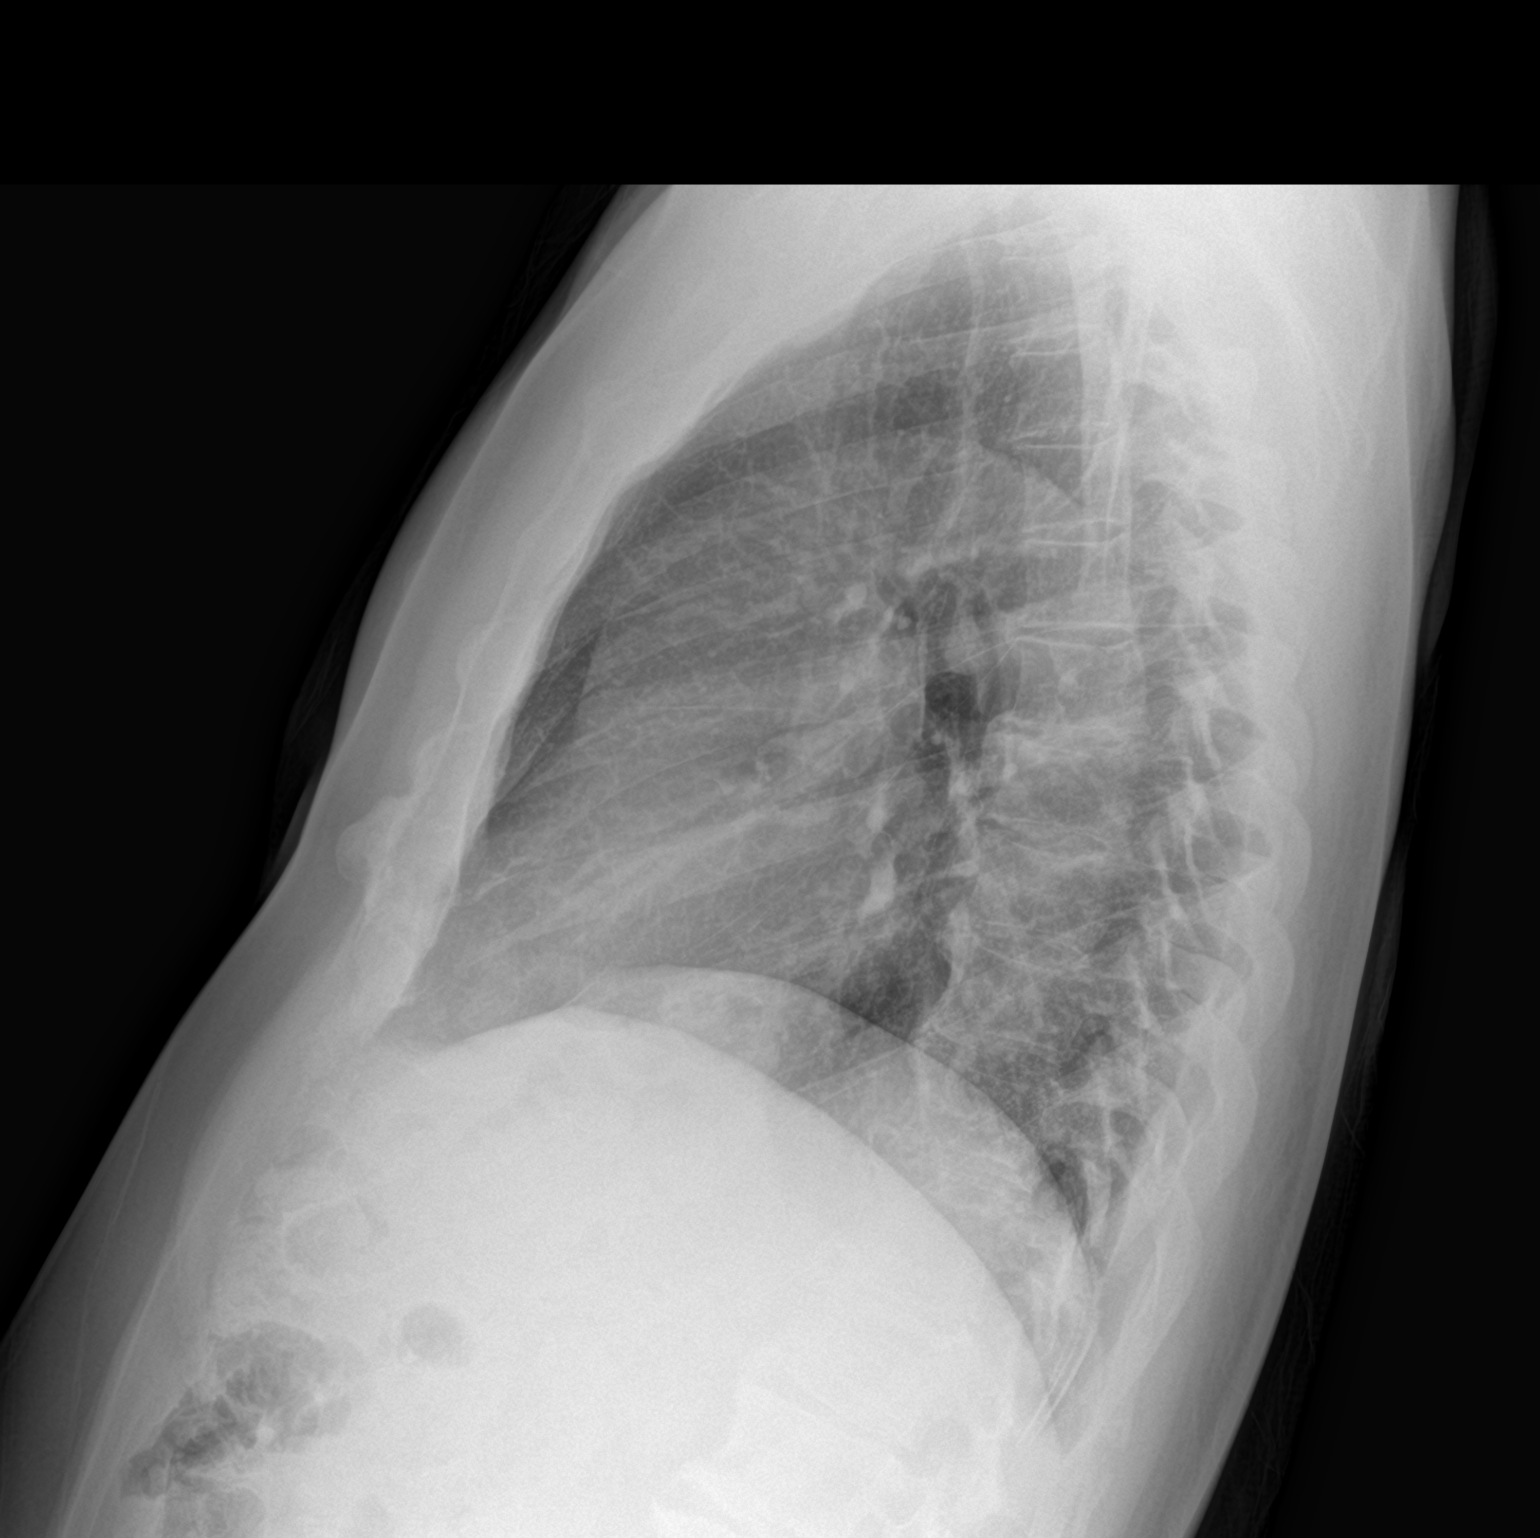

[2 of 2 positions shown; findings below may reference images not displayed]

FINDINGS: The heart size and mediastinal contours are within normal limits.
Both lungs are clear. Prominent arthritic changes of the left
glenohumeral joint. The bones are otherwise normal.
IMPRESSION: No active cardiopulmonary disease.

## 2021-07-04 IMAGING — DX DG SHOULDER 1V*L*
1 series · 1 of 1 positions shown · non-contrast
Comparison: 09/03/2018

CLINICAL DATA: Postop from left shoulder replacement.

EXAM:
LEFT SHOULDER

[shoulder ap]
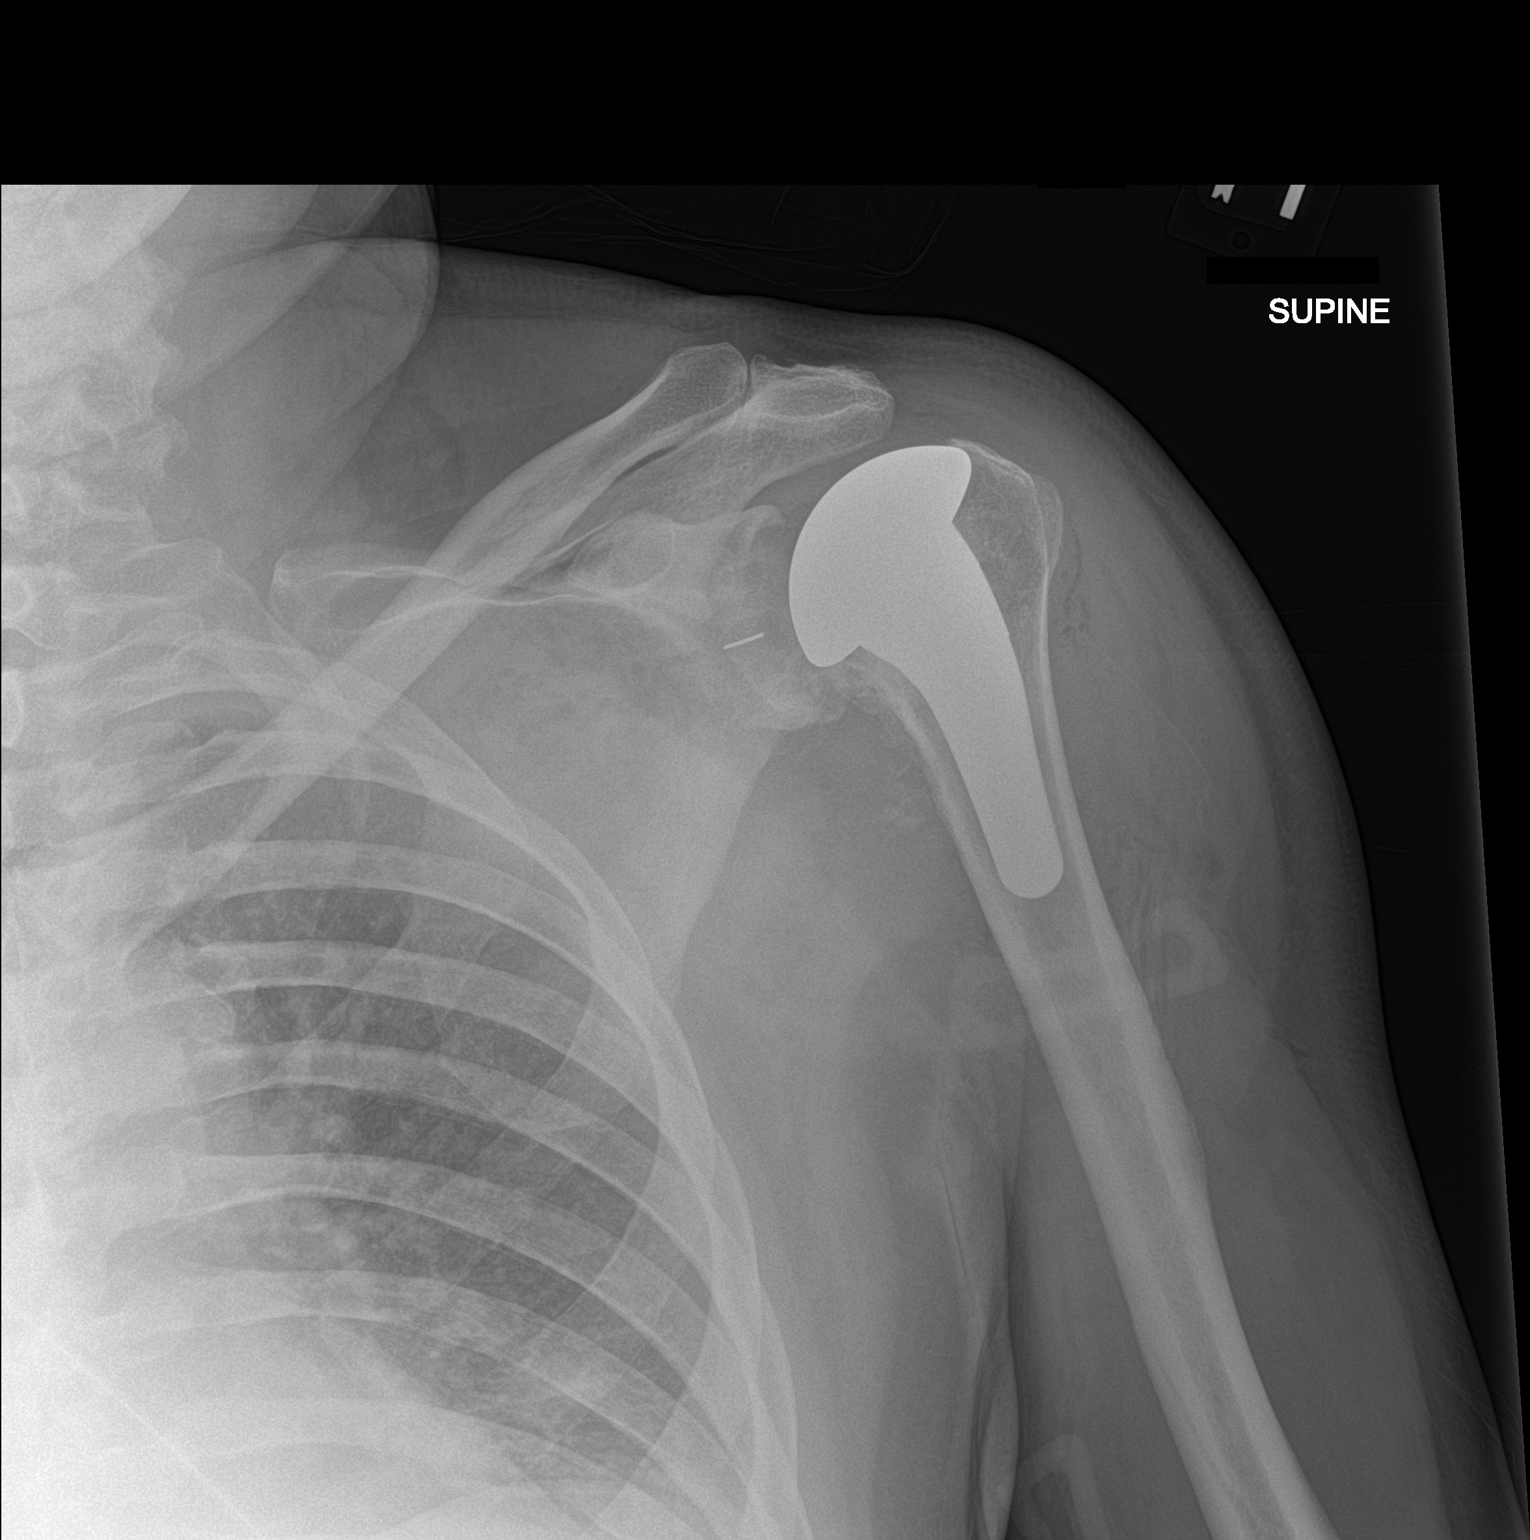

[1 of 1 positions shown; findings below may reference images not displayed]

FINDINGS: Left shoulder arthroplasty appears well seated and well aligned.

No acute fracture or evidence of an operative complication.
IMPRESSION: Well-positioned left shoulder arthroplasty.

## 2021-08-16 DIAGNOSIS — N401 Enlarged prostate with lower urinary tract symptoms: Secondary | ICD-10-CM | POA: Diagnosis not present

## 2021-08-16 DIAGNOSIS — R351 Nocturia: Secondary | ICD-10-CM | POA: Diagnosis not present

## 2021-08-27 DIAGNOSIS — H5202 Hypermetropia, left eye: Secondary | ICD-10-CM | POA: Diagnosis not present

## 2021-09-10 DIAGNOSIS — L853 Xerosis cutis: Secondary | ICD-10-CM | POA: Diagnosis not present

## 2021-09-10 DIAGNOSIS — L578 Other skin changes due to chronic exposure to nonionizing radiation: Secondary | ICD-10-CM | POA: Diagnosis not present

## 2021-09-10 DIAGNOSIS — L814 Other melanin hyperpigmentation: Secondary | ICD-10-CM | POA: Diagnosis not present

## 2021-09-10 DIAGNOSIS — D225 Melanocytic nevi of trunk: Secondary | ICD-10-CM | POA: Diagnosis not present

## 2021-09-21 DIAGNOSIS — N401 Enlarged prostate with lower urinary tract symptoms: Secondary | ICD-10-CM | POA: Diagnosis not present

## 2021-12-14 DIAGNOSIS — Z9189 Other specified personal risk factors, not elsewhere classified: Secondary | ICD-10-CM | POA: Diagnosis not present

## 2021-12-14 DIAGNOSIS — Z136 Encounter for screening for cardiovascular disorders: Secondary | ICD-10-CM | POA: Diagnosis not present

## 2021-12-20 DIAGNOSIS — Z130 Encounter for screening for diseases of the blood and blood-forming organs and certain disorders involving the immune mechanism: Secondary | ICD-10-CM | POA: Diagnosis not present

## 2021-12-20 DIAGNOSIS — Z9189 Other specified personal risk factors, not elsewhere classified: Secondary | ICD-10-CM | POA: Diagnosis not present

## 2021-12-20 DIAGNOSIS — Z8679 Personal history of other diseases of the circulatory system: Secondary | ICD-10-CM | POA: Diagnosis not present

## 2021-12-20 DIAGNOSIS — R399 Unspecified symptoms and signs involving the genitourinary system: Secondary | ICD-10-CM | POA: Diagnosis not present

## 2021-12-20 DIAGNOSIS — I10 Essential (primary) hypertension: Secondary | ICD-10-CM | POA: Diagnosis not present

## 2021-12-20 DIAGNOSIS — Z13228 Encounter for screening for other metabolic disorders: Secondary | ICD-10-CM | POA: Diagnosis not present

## 2021-12-20 DIAGNOSIS — E663 Overweight: Secondary | ICD-10-CM | POA: Diagnosis not present

## 2021-12-20 DIAGNOSIS — Z1329 Encounter for screening for other suspected endocrine disorder: Secondary | ICD-10-CM | POA: Diagnosis not present

## 2021-12-20 DIAGNOSIS — Z6829 Body mass index (BMI) 29.0-29.9, adult: Secondary | ICD-10-CM | POA: Diagnosis not present

## 2022-01-01 DIAGNOSIS — I10 Essential (primary) hypertension: Secondary | ICD-10-CM | POA: Diagnosis not present

## 2022-01-01 DIAGNOSIS — I83892 Varicose veins of left lower extremities with other complications: Secondary | ICD-10-CM | POA: Diagnosis not present

## 2022-01-01 DIAGNOSIS — M25562 Pain in left knee: Secondary | ICD-10-CM | POA: Diagnosis not present

## 2022-01-01 DIAGNOSIS — M25462 Effusion, left knee: Secondary | ICD-10-CM | POA: Diagnosis not present

## 2022-01-02 DIAGNOSIS — I83892 Varicose veins of left lower extremities with other complications: Secondary | ICD-10-CM | POA: Diagnosis not present

## 2022-01-02 DIAGNOSIS — M25462 Effusion, left knee: Secondary | ICD-10-CM | POA: Diagnosis not present

## 2022-01-02 DIAGNOSIS — M25562 Pain in left knee: Secondary | ICD-10-CM | POA: Diagnosis not present

## 2022-01-18 DIAGNOSIS — Z936 Other artificial openings of urinary tract status: Secondary | ICD-10-CM | POA: Diagnosis not present

## 2022-02-18 DIAGNOSIS — N401 Enlarged prostate with lower urinary tract symptoms: Secondary | ICD-10-CM | POA: Diagnosis not present

## 2022-03-11 DIAGNOSIS — I10 Essential (primary) hypertension: Secondary | ICD-10-CM | POA: Diagnosis not present

## 2022-03-11 DIAGNOSIS — I872 Venous insufficiency (chronic) (peripheral): Secondary | ICD-10-CM | POA: Diagnosis not present

## 2022-03-11 DIAGNOSIS — I83813 Varicose veins of bilateral lower extremities with pain: Secondary | ICD-10-CM | POA: Diagnosis not present

## 2022-03-24 DIAGNOSIS — R69 Illness, unspecified: Secondary | ICD-10-CM | POA: Diagnosis not present

## 2022-03-26 DIAGNOSIS — I83812 Varicose veins of left lower extremities with pain: Secondary | ICD-10-CM | POA: Diagnosis not present

## 2022-04-10 DIAGNOSIS — I83812 Varicose veins of left lower extremities with pain: Secondary | ICD-10-CM | POA: Diagnosis not present

## 2022-04-17 DIAGNOSIS — I10 Essential (primary) hypertension: Secondary | ICD-10-CM | POA: Diagnosis not present

## 2022-04-17 DIAGNOSIS — L03012 Cellulitis of left finger: Secondary | ICD-10-CM | POA: Diagnosis not present

## 2022-04-29 DIAGNOSIS — I83812 Varicose veins of left lower extremities with pain: Secondary | ICD-10-CM | POA: Diagnosis not present

## 2022-05-02 DIAGNOSIS — L03012 Cellulitis of left finger: Secondary | ICD-10-CM | POA: Diagnosis not present

## 2022-05-02 DIAGNOSIS — I10 Essential (primary) hypertension: Secondary | ICD-10-CM | POA: Diagnosis not present

## 2022-05-17 DIAGNOSIS — I83812 Varicose veins of left lower extremities with pain: Secondary | ICD-10-CM | POA: Diagnosis not present

## 2022-05-17 DIAGNOSIS — I10 Essential (primary) hypertension: Secondary | ICD-10-CM | POA: Diagnosis not present

## 2022-06-12 DIAGNOSIS — I8392 Asymptomatic varicose veins of left lower extremity: Secondary | ICD-10-CM | POA: Diagnosis not present

## 2022-06-12 DIAGNOSIS — I10 Essential (primary) hypertension: Secondary | ICD-10-CM | POA: Diagnosis not present

## 2022-06-12 DIAGNOSIS — Z79899 Other long term (current) drug therapy: Secondary | ICD-10-CM | POA: Diagnosis not present

## 2022-06-12 DIAGNOSIS — I83812 Varicose veins of left lower extremities with pain: Secondary | ICD-10-CM | POA: Diagnosis not present

## 2022-06-12 DIAGNOSIS — Z7982 Long term (current) use of aspirin: Secondary | ICD-10-CM | POA: Diagnosis not present

## 2022-06-12 DIAGNOSIS — Z888 Allergy status to other drugs, medicaments and biological substances status: Secondary | ICD-10-CM | POA: Diagnosis not present

## 2022-06-24 DIAGNOSIS — I10 Essential (primary) hypertension: Secondary | ICD-10-CM | POA: Diagnosis not present

## 2022-06-24 DIAGNOSIS — E663 Overweight: Secondary | ICD-10-CM | POA: Diagnosis not present

## 2022-06-24 DIAGNOSIS — Z0000 Encounter for general adult medical examination without abnormal findings: Secondary | ICD-10-CM | POA: Diagnosis not present

## 2022-06-24 DIAGNOSIS — Z13228 Encounter for screening for other metabolic disorders: Secondary | ICD-10-CM | POA: Diagnosis not present

## 2022-06-24 DIAGNOSIS — Z6829 Body mass index (BMI) 29.0-29.9, adult: Secondary | ICD-10-CM | POA: Diagnosis not present

## 2022-06-24 DIAGNOSIS — Z9189 Other specified personal risk factors, not elsewhere classified: Secondary | ICD-10-CM | POA: Diagnosis not present

## 2022-06-24 DIAGNOSIS — Z130 Encounter for screening for diseases of the blood and blood-forming organs and certain disorders involving the immune mechanism: Secondary | ICD-10-CM | POA: Diagnosis not present

## 2022-06-24 DIAGNOSIS — I8392 Asymptomatic varicose veins of left lower extremity: Secondary | ICD-10-CM | POA: Diagnosis not present

## 2022-06-24 DIAGNOSIS — R7303 Prediabetes: Secondary | ICD-10-CM | POA: Diagnosis not present

## 2022-06-24 DIAGNOSIS — Z8679 Personal history of other diseases of the circulatory system: Secondary | ICD-10-CM | POA: Diagnosis not present

## 2022-06-24 DIAGNOSIS — Z1329 Encounter for screening for other suspected endocrine disorder: Secondary | ICD-10-CM | POA: Diagnosis not present

## 2022-07-12 DIAGNOSIS — I10 Essential (primary) hypertension: Secondary | ICD-10-CM | POA: Diagnosis not present

## 2022-07-12 DIAGNOSIS — I83813 Varicose veins of bilateral lower extremities with pain: Secondary | ICD-10-CM | POA: Diagnosis not present

## 2022-07-30 ENCOUNTER — Telehealth: Payer: Self-pay | Admitting: Family Medicine

## 2022-07-30 NOTE — Telephone Encounter (Signed)
Called patient to schedule Medicare Annual Wellness Visit (AWV). Left message for patient to call back and schedule Medicare Annual Wellness Visit (AWV).  Last date of AWV: 03/14/2020  Please schedule an appointment at any time with [facility].  If any questions, please contact me at [phone].  Thank you ,  Randon Goldsmith Care Guide [facility] AWV TEAM Direct Dial: [phone]

## 2022-07-30 NOTE — Telephone Encounter (Signed)
Contacted Dalton Delacruz to schedule their annual wellness visit. Patient declined to schedule AWV at this time. Moved to [place] [employer].  [facility] Care Guide [facility] AWV TEAM Direct Dial: [phone]

## 2022-09-09 DIAGNOSIS — L814 Other melanin hyperpigmentation: Secondary | ICD-10-CM | POA: Diagnosis not present

## 2022-09-09 DIAGNOSIS — L853 Xerosis cutis: Secondary | ICD-10-CM | POA: Diagnosis not present

## 2022-09-09 DIAGNOSIS — D492 Neoplasm of unspecified behavior of bone, soft tissue, and skin: Secondary | ICD-10-CM | POA: Diagnosis not present

## 2022-09-09 DIAGNOSIS — N401 Enlarged prostate with lower urinary tract symptoms: Secondary | ICD-10-CM | POA: Diagnosis not present

## 2022-09-09 DIAGNOSIS — L578 Other skin changes due to chronic exposure to nonionizing radiation: Secondary | ICD-10-CM | POA: Diagnosis not present

## 2022-09-09 DIAGNOSIS — R351 Nocturia: Secondary | ICD-10-CM | POA: Diagnosis not present

## 2022-09-09 DIAGNOSIS — D225 Melanocytic nevi of trunk: Secondary | ICD-10-CM | POA: Diagnosis not present

## 2022-09-10 DIAGNOSIS — R69 Illness, unspecified: Secondary | ICD-10-CM | POA: Diagnosis not present

## 2022-10-16 DIAGNOSIS — H524 Presbyopia: Secondary | ICD-10-CM | POA: Diagnosis not present

## 2022-10-16 DIAGNOSIS — H52223 Regular astigmatism, bilateral: Secondary | ICD-10-CM | POA: Diagnosis not present

## 2022-10-21 DIAGNOSIS — R350 Frequency of micturition: Secondary | ICD-10-CM | POA: Diagnosis not present

## 2022-10-21 DIAGNOSIS — N401 Enlarged prostate with lower urinary tract symptoms: Secondary | ICD-10-CM | POA: Diagnosis not present

## 2022-11-19 DIAGNOSIS — E0789 Other specified disorders of thyroid: Secondary | ICD-10-CM | POA: Diagnosis not present

## 2022-11-19 DIAGNOSIS — E010 Iodine-deficiency related diffuse (endemic) goiter: Secondary | ICD-10-CM | POA: Diagnosis not present

## 2022-11-19 DIAGNOSIS — E069 Thyroiditis, unspecified: Secondary | ICD-10-CM | POA: Diagnosis not present

## 2022-12-05 DIAGNOSIS — R3915 Urgency of urination: Secondary | ICD-10-CM | POA: Diagnosis not present

## 2022-12-05 DIAGNOSIS — R350 Frequency of micturition: Secondary | ICD-10-CM | POA: Diagnosis not present

## 2022-12-05 DIAGNOSIS — N401 Enlarged prostate with lower urinary tract symptoms: Secondary | ICD-10-CM | POA: Diagnosis not present

## 2022-12-13 DIAGNOSIS — R053 Chronic cough: Secondary | ICD-10-CM | POA: Diagnosis not present

## 2022-12-30 DIAGNOSIS — I10 Essential (primary) hypertension: Secondary | ICD-10-CM | POA: Diagnosis not present

## 2022-12-30 DIAGNOSIS — E7800 Pure hypercholesterolemia, unspecified: Secondary | ICD-10-CM | POA: Diagnosis not present

## 2022-12-30 DIAGNOSIS — R7303 Prediabetes: Secondary | ICD-10-CM | POA: Diagnosis not present

## 2022-12-31 DIAGNOSIS — R053 Chronic cough: Secondary | ICD-10-CM | POA: Diagnosis not present

## 2022-12-31 DIAGNOSIS — J439 Emphysema, unspecified: Secondary | ICD-10-CM | POA: Diagnosis not present

## 2023-01-21 DIAGNOSIS — R3915 Urgency of urination: Secondary | ICD-10-CM | POA: Diagnosis not present

## 2023-01-21 DIAGNOSIS — N401 Enlarged prostate with lower urinary tract symptoms: Secondary | ICD-10-CM | POA: Diagnosis not present

## 2023-03-04 DIAGNOSIS — R918 Other nonspecific abnormal finding of lung field: Secondary | ICD-10-CM | POA: Diagnosis not present

## 2023-03-04 DIAGNOSIS — R051 Acute cough: Secondary | ICD-10-CM | POA: Diagnosis not present

## 2023-04-02 DIAGNOSIS — N401 Enlarged prostate with lower urinary tract symptoms: Secondary | ICD-10-CM | POA: Diagnosis not present

## 2023-04-02 DIAGNOSIS — I10 Essential (primary) hypertension: Secondary | ICD-10-CM | POA: Diagnosis not present

## 2023-04-02 DIAGNOSIS — N400 Enlarged prostate without lower urinary tract symptoms: Secondary | ICD-10-CM | POA: Diagnosis not present

## 2023-04-04 DIAGNOSIS — R339 Retention of urine, unspecified: Secondary | ICD-10-CM | POA: Diagnosis not present

## 2023-04-04 DIAGNOSIS — Z9079 Acquired absence of other genital organ(s): Secondary | ICD-10-CM | POA: Diagnosis not present

## 2023-04-04 DIAGNOSIS — N4889 Other specified disorders of penis: Secondary | ICD-10-CM | POA: Diagnosis not present

## 2023-04-04 DIAGNOSIS — N401 Enlarged prostate with lower urinary tract symptoms: Secondary | ICD-10-CM | POA: Diagnosis not present

## 2023-04-04 DIAGNOSIS — R310 Gross hematuria: Secondary | ICD-10-CM | POA: Diagnosis not present

## 2023-04-05 DIAGNOSIS — N3289 Other specified disorders of bladder: Secondary | ICD-10-CM | POA: Diagnosis not present

## 2023-04-05 DIAGNOSIS — Z888 Allergy status to other drugs, medicaments and biological substances status: Secondary | ICD-10-CM | POA: Diagnosis not present

## 2023-04-05 DIAGNOSIS — Z7982 Long term (current) use of aspirin: Secondary | ICD-10-CM | POA: Diagnosis not present

## 2023-04-05 DIAGNOSIS — R3915 Urgency of urination: Secondary | ICD-10-CM | POA: Diagnosis not present

## 2023-04-05 DIAGNOSIS — N401 Enlarged prostate with lower urinary tract symptoms: Secondary | ICD-10-CM | POA: Diagnosis not present

## 2023-04-05 DIAGNOSIS — N4889 Other specified disorders of penis: Secondary | ICD-10-CM | POA: Diagnosis not present

## 2023-04-05 DIAGNOSIS — R350 Frequency of micturition: Secondary | ICD-10-CM | POA: Diagnosis not present

## 2023-04-05 DIAGNOSIS — R319 Hematuria, unspecified: Secondary | ICD-10-CM | POA: Diagnosis not present

## 2023-04-05 DIAGNOSIS — N138 Other obstructive and reflux uropathy: Secondary | ICD-10-CM | POA: Diagnosis not present

## 2023-04-05 DIAGNOSIS — Z9079 Acquired absence of other genital organ(s): Secondary | ICD-10-CM | POA: Diagnosis not present

## 2023-04-05 DIAGNOSIS — I8392 Asymptomatic varicose veins of left lower extremity: Secondary | ICD-10-CM | POA: Diagnosis not present

## 2023-04-05 DIAGNOSIS — R339 Retention of urine, unspecified: Secondary | ICD-10-CM | POA: Diagnosis not present

## 2023-04-05 DIAGNOSIS — R310 Gross hematuria: Secondary | ICD-10-CM | POA: Diagnosis not present

## 2023-04-05 DIAGNOSIS — Z978 Presence of other specified devices: Secondary | ICD-10-CM | POA: Diagnosis not present

## 2023-04-05 DIAGNOSIS — R338 Other retention of urine: Secondary | ICD-10-CM | POA: Diagnosis not present

## 2023-04-05 DIAGNOSIS — I10 Essential (primary) hypertension: Secondary | ICD-10-CM | POA: Diagnosis not present

## 2023-04-06 DIAGNOSIS — N401 Enlarged prostate with lower urinary tract symptoms: Secondary | ICD-10-CM | POA: Diagnosis not present

## 2023-04-06 DIAGNOSIS — I8392 Asymptomatic varicose veins of left lower extremity: Secondary | ICD-10-CM | POA: Diagnosis not present

## 2023-04-06 DIAGNOSIS — N4889 Other specified disorders of penis: Secondary | ICD-10-CM | POA: Diagnosis not present

## 2023-04-06 DIAGNOSIS — I10 Essential (primary) hypertension: Secondary | ICD-10-CM | POA: Diagnosis not present

## 2023-04-06 DIAGNOSIS — R310 Gross hematuria: Secondary | ICD-10-CM | POA: Diagnosis not present

## 2023-04-06 DIAGNOSIS — R319 Hematuria, unspecified: Secondary | ICD-10-CM | POA: Diagnosis not present

## 2023-04-06 DIAGNOSIS — R339 Retention of urine, unspecified: Secondary | ICD-10-CM | POA: Diagnosis not present

## 2023-04-06 DIAGNOSIS — Z9079 Acquired absence of other genital organ(s): Secondary | ICD-10-CM | POA: Diagnosis not present

## 2023-04-07 DIAGNOSIS — Z9079 Acquired absence of other genital organ(s): Secondary | ICD-10-CM | POA: Diagnosis not present

## 2023-04-07 DIAGNOSIS — N4889 Other specified disorders of penis: Secondary | ICD-10-CM | POA: Diagnosis not present

## 2023-04-07 DIAGNOSIS — R339 Retention of urine, unspecified: Secondary | ICD-10-CM | POA: Diagnosis not present

## 2023-04-07 DIAGNOSIS — I10 Essential (primary) hypertension: Secondary | ICD-10-CM | POA: Diagnosis not present

## 2023-04-07 DIAGNOSIS — I8392 Asymptomatic varicose veins of left lower extremity: Secondary | ICD-10-CM | POA: Diagnosis not present

## 2023-04-07 DIAGNOSIS — N401 Enlarged prostate with lower urinary tract symptoms: Secondary | ICD-10-CM | POA: Diagnosis not present

## 2023-04-07 DIAGNOSIS — R319 Hematuria, unspecified: Secondary | ICD-10-CM | POA: Diagnosis not present

## 2023-04-07 DIAGNOSIS — R310 Gross hematuria: Secondary | ICD-10-CM | POA: Diagnosis not present

## 2023-04-10 DIAGNOSIS — R011 Cardiac murmur, unspecified: Secondary | ICD-10-CM | POA: Diagnosis not present

## 2023-04-15 DIAGNOSIS — R918 Other nonspecific abnormal finding of lung field: Secondary | ICD-10-CM | POA: Diagnosis not present

## 2023-04-15 DIAGNOSIS — R053 Chronic cough: Secondary | ICD-10-CM | POA: Diagnosis not present

## 2023-04-16 DIAGNOSIS — R011 Cardiac murmur, unspecified: Secondary | ICD-10-CM | POA: Diagnosis not present

## 2023-04-22 DIAGNOSIS — R053 Chronic cough: Secondary | ICD-10-CM | POA: Diagnosis not present

## 2023-04-22 DIAGNOSIS — R0600 Dyspnea, unspecified: Secondary | ICD-10-CM | POA: Diagnosis not present

## 2023-04-30 DIAGNOSIS — R011 Cardiac murmur, unspecified: Secondary | ICD-10-CM | POA: Diagnosis not present

## 2023-05-01 DIAGNOSIS — N401 Enlarged prostate with lower urinary tract symptoms: Secondary | ICD-10-CM | POA: Diagnosis not present

## 2023-05-05 DIAGNOSIS — I517 Cardiomegaly: Secondary | ICD-10-CM | POA: Diagnosis not present

## 2023-05-05 DIAGNOSIS — I351 Nonrheumatic aortic (valve) insufficiency: Secondary | ICD-10-CM | POA: Diagnosis not present

## 2023-05-05 DIAGNOSIS — R053 Chronic cough: Secondary | ICD-10-CM | POA: Diagnosis not present

## 2023-05-14 DIAGNOSIS — I517 Cardiomegaly: Secondary | ICD-10-CM | POA: Diagnosis not present

## 2023-05-21 DIAGNOSIS — L820 Inflamed seborrheic keratosis: Secondary | ICD-10-CM | POA: Diagnosis not present

## 2023-05-21 DIAGNOSIS — L853 Xerosis cutis: Secondary | ICD-10-CM | POA: Diagnosis not present

## 2023-05-21 DIAGNOSIS — D492 Neoplasm of unspecified behavior of bone, soft tissue, and skin: Secondary | ICD-10-CM | POA: Diagnosis not present

## 2023-05-21 DIAGNOSIS — L570 Actinic keratosis: Secondary | ICD-10-CM | POA: Diagnosis not present

## 2023-05-27 DIAGNOSIS — R053 Chronic cough: Secondary | ICD-10-CM | POA: Diagnosis not present

## 2023-05-27 DIAGNOSIS — R918 Other nonspecific abnormal finding of lung field: Secondary | ICD-10-CM | POA: Diagnosis not present

## 2023-06-17 DIAGNOSIS — R918 Other nonspecific abnormal finding of lung field: Secondary | ICD-10-CM | POA: Diagnosis not present

## 2023-06-17 DIAGNOSIS — R051 Acute cough: Secondary | ICD-10-CM | POA: Diagnosis not present

## 2023-06-27 DIAGNOSIS — R053 Chronic cough: Secondary | ICD-10-CM | POA: Diagnosis not present

## 2023-06-30 DIAGNOSIS — R053 Chronic cough: Secondary | ICD-10-CM | POA: Diagnosis not present

## 2023-06-30 DIAGNOSIS — N401 Enlarged prostate with lower urinary tract symptoms: Secondary | ICD-10-CM | POA: Diagnosis not present

## 2023-06-30 DIAGNOSIS — Z1211 Encounter for screening for malignant neoplasm of colon: Secondary | ICD-10-CM | POA: Diagnosis not present

## 2023-06-30 DIAGNOSIS — I10 Essential (primary) hypertension: Secondary | ICD-10-CM | POA: Diagnosis not present

## 2023-06-30 DIAGNOSIS — Z0000 Encounter for general adult medical examination without abnormal findings: Secondary | ICD-10-CM | POA: Diagnosis not present

## 2023-06-30 DIAGNOSIS — I351 Nonrheumatic aortic (valve) insufficiency: Secondary | ICD-10-CM | POA: Diagnosis not present

## 2023-08-05 DIAGNOSIS — I351 Nonrheumatic aortic (valve) insufficiency: Secondary | ICD-10-CM | POA: Diagnosis not present

## 2023-08-05 DIAGNOSIS — I10 Essential (primary) hypertension: Secondary | ICD-10-CM | POA: Diagnosis not present

## 2023-08-27 DIAGNOSIS — I351 Nonrheumatic aortic (valve) insufficiency: Secondary | ICD-10-CM | POA: Diagnosis not present

## 2023-08-27 DIAGNOSIS — I10 Essential (primary) hypertension: Secondary | ICD-10-CM | POA: Diagnosis not present

## 2023-09-05 DIAGNOSIS — R1310 Dysphagia, unspecified: Secondary | ICD-10-CM | POA: Diagnosis not present

## 2023-09-05 DIAGNOSIS — R633 Feeding difficulties, unspecified: Secondary | ICD-10-CM | POA: Diagnosis not present

## 2023-09-05 DIAGNOSIS — R053 Chronic cough: Secondary | ICD-10-CM | POA: Diagnosis not present

## 2023-10-07 DIAGNOSIS — K219 Gastro-esophageal reflux disease without esophagitis: Secondary | ICD-10-CM | POA: Diagnosis not present

## 2023-10-23 DIAGNOSIS — I10 Essential (primary) hypertension: Secondary | ICD-10-CM | POA: Diagnosis not present

## 2023-10-23 DIAGNOSIS — I351 Nonrheumatic aortic (valve) insufficiency: Secondary | ICD-10-CM | POA: Diagnosis not present

## 2023-11-17 DIAGNOSIS — L814 Other melanin hyperpigmentation: Secondary | ICD-10-CM | POA: Diagnosis not present

## 2023-11-17 DIAGNOSIS — L578 Other skin changes due to chronic exposure to nonionizing radiation: Secondary | ICD-10-CM | POA: Diagnosis not present

## 2023-11-17 DIAGNOSIS — L853 Xerosis cutis: Secondary | ICD-10-CM | POA: Diagnosis not present

## 2023-11-17 DIAGNOSIS — D225 Melanocytic nevi of trunk: Secondary | ICD-10-CM | POA: Diagnosis not present

## 2023-11-17 DIAGNOSIS — H5202 Hypermetropia, left eye: Secondary | ICD-10-CM | POA: Diagnosis not present

## 2023-11-21 DIAGNOSIS — I351 Nonrheumatic aortic (valve) insufficiency: Secondary | ICD-10-CM | POA: Diagnosis not present

## 2023-11-21 DIAGNOSIS — I10 Essential (primary) hypertension: Secondary | ICD-10-CM | POA: Diagnosis not present

## 2023-12-04 DIAGNOSIS — I2510 Atherosclerotic heart disease of native coronary artery without angina pectoris: Secondary | ICD-10-CM | POA: Diagnosis not present

## 2023-12-04 DIAGNOSIS — K828 Other specified diseases of gallbladder: Secondary | ICD-10-CM | POA: Diagnosis not present

## 2023-12-04 DIAGNOSIS — K7689 Other specified diseases of liver: Secondary | ICD-10-CM | POA: Diagnosis not present

## 2023-12-04 DIAGNOSIS — R0602 Shortness of breath: Secondary | ICD-10-CM | POA: Diagnosis not present

## 2023-12-04 DIAGNOSIS — K5730 Diverticulosis of large intestine without perforation or abscess without bleeding: Secondary | ICD-10-CM | POA: Diagnosis not present

## 2023-12-04 DIAGNOSIS — R109 Unspecified abdominal pain: Secondary | ICD-10-CM | POA: Diagnosis not present

## 2023-12-04 DIAGNOSIS — R1011 Right upper quadrant pain: Secondary | ICD-10-CM | POA: Diagnosis not present

## 2023-12-04 DIAGNOSIS — R030 Elevated blood-pressure reading, without diagnosis of hypertension: Secondary | ICD-10-CM | POA: Diagnosis not present

## 2023-12-08 DIAGNOSIS — Z1330 Encounter for screening examination for mental health and behavioral disorders, unspecified: Secondary | ICD-10-CM | POA: Diagnosis not present

## 2023-12-08 DIAGNOSIS — I10 Essential (primary) hypertension: Secondary | ICD-10-CM | POA: Diagnosis not present

## 2023-12-08 DIAGNOSIS — K828 Other specified diseases of gallbladder: Secondary | ICD-10-CM | POA: Diagnosis not present

## 2023-12-08 DIAGNOSIS — K824 Cholesterolosis of gallbladder: Secondary | ICD-10-CM | POA: Diagnosis not present

## 2023-12-15 DIAGNOSIS — Z7982 Long term (current) use of aspirin: Secondary | ICD-10-CM | POA: Diagnosis not present

## 2023-12-15 DIAGNOSIS — K824 Cholesterolosis of gallbladder: Secondary | ICD-10-CM | POA: Diagnosis not present

## 2023-12-15 DIAGNOSIS — I10 Essential (primary) hypertension: Secondary | ICD-10-CM | POA: Diagnosis not present

## 2023-12-15 DIAGNOSIS — I351 Nonrheumatic aortic (valve) insufficiency: Secondary | ICD-10-CM | POA: Diagnosis not present

## 2023-12-15 DIAGNOSIS — N401 Enlarged prostate with lower urinary tract symptoms: Secondary | ICD-10-CM | POA: Diagnosis not present

## 2023-12-15 DIAGNOSIS — K7689 Other specified diseases of liver: Secondary | ICD-10-CM | POA: Diagnosis not present

## 2023-12-15 DIAGNOSIS — K811 Chronic cholecystitis: Secondary | ICD-10-CM | POA: Diagnosis not present

## 2023-12-19 DIAGNOSIS — Z23 Encounter for immunization: Secondary | ICD-10-CM | POA: Diagnosis not present

## 2023-12-19 DIAGNOSIS — I10 Essential (primary) hypertension: Secondary | ICD-10-CM | POA: Diagnosis not present

## 2023-12-19 DIAGNOSIS — I351 Nonrheumatic aortic (valve) insufficiency: Secondary | ICD-10-CM | POA: Diagnosis not present

## 2024-01-16 DIAGNOSIS — I10 Essential (primary) hypertension: Secondary | ICD-10-CM | POA: Diagnosis not present

## 2024-01-16 DIAGNOSIS — I351 Nonrheumatic aortic (valve) insufficiency: Secondary | ICD-10-CM | POA: Diagnosis not present

## 2024-02-17 DIAGNOSIS — I351 Nonrheumatic aortic (valve) insufficiency: Secondary | ICD-10-CM | POA: Diagnosis not present

## 2024-02-17 DIAGNOSIS — I10 Essential (primary) hypertension: Secondary | ICD-10-CM | POA: Diagnosis not present

## 2024-02-24 DIAGNOSIS — J101 Influenza due to other identified influenza virus with other respiratory manifestations: Secondary | ICD-10-CM | POA: Diagnosis not present

## 2024-02-24 DIAGNOSIS — R6889 Other general symptoms and signs: Secondary | ICD-10-CM | POA: Diagnosis not present
